# Patient Record
Sex: Female | Born: 2000 | Race: White | Hispanic: No | Smoking: Former smoker
Health system: Southern US, Community
[De-identification: ages and names within clinical notes are randomized; demographics above are authoritative.]

## PROBLEM LIST (undated history)

## (undated) DIAGNOSIS — F329 Major depressive disorder, single episode, unspecified: Secondary | ICD-10-CM

## (undated) DIAGNOSIS — F32A Depression, unspecified: Secondary | ICD-10-CM

## (undated) DIAGNOSIS — R51 Headache: Secondary | ICD-10-CM

## (undated) DIAGNOSIS — Z151 Genetic susceptibility to epilepsy and neurodevelopmental disorders: Secondary | ICD-10-CM

## (undated) DIAGNOSIS — F840 Autistic disorder: Secondary | ICD-10-CM

## (undated) DIAGNOSIS — F419 Anxiety disorder, unspecified: Secondary | ICD-10-CM

## (undated) DIAGNOSIS — R519 Headache, unspecified: Secondary | ICD-10-CM

## (undated) DIAGNOSIS — Q8789 Other specified congenital malformation syndromes, not elsewhere classified: Secondary | ICD-10-CM

## (undated) DIAGNOSIS — Q825 Congenital non-neoplastic nevus: Secondary | ICD-10-CM

## (undated) DIAGNOSIS — H539 Unspecified visual disturbance: Secondary | ICD-10-CM

## (undated) DIAGNOSIS — J45909 Unspecified asthma, uncomplicated: Secondary | ICD-10-CM

## (undated) DIAGNOSIS — M1990 Unspecified osteoarthritis, unspecified site: Secondary | ICD-10-CM

## (undated) DIAGNOSIS — F509 Eating disorder, unspecified: Secondary | ICD-10-CM

## (undated) DIAGNOSIS — F319 Bipolar disorder, unspecified: Secondary | ICD-10-CM

## (undated) DIAGNOSIS — T7840XA Allergy, unspecified, initial encounter: Secondary | ICD-10-CM

## (undated) HISTORY — DX: Congenital non-neoplastic nevus: Q82.5

## (undated) HISTORY — DX: Genetic susceptibility to epilepsy and neurodevelopmental disorders: Z15.1

## (undated) HISTORY — DX: Autistic disorder: F84.0

## (undated) HISTORY — DX: Autistic disorder: Q87.89

---
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
## 2018-01-31 ENCOUNTER — Encounter: Payer: Self-pay | Admitting: Pediatrics

## 2018-04-18 ENCOUNTER — Encounter (HOSPITAL_COMMUNITY): Payer: Self-pay | Admitting: Emergency Medicine

## 2018-04-18 ENCOUNTER — Other Ambulatory Visit: Payer: Self-pay

## 2018-04-18 ENCOUNTER — Emergency Department (HOSPITAL_COMMUNITY)
Admission: EM | Admit: 2018-04-18 | Discharge: 2018-04-19 | Disposition: A | Discharge status: Home / Self Care | Payer: Medicaid Other | Attending: Emergency Medicine | Admitting: Emergency Medicine

## 2018-04-18 DIAGNOSIS — F17200 Nicotine dependence, unspecified, uncomplicated: Secondary | ICD-10-CM | POA: Insufficient documentation

## 2018-04-18 DIAGNOSIS — J45909 Unspecified asthma, uncomplicated: Secondary | ICD-10-CM | POA: Diagnosis not present

## 2018-04-18 DIAGNOSIS — R1030 Lower abdominal pain, unspecified: Secondary | ICD-10-CM | POA: Diagnosis present

## 2018-04-18 DIAGNOSIS — N946 Dysmenorrhea, unspecified: Secondary | ICD-10-CM | POA: Insufficient documentation

## 2018-04-18 LAB — CBC
HCT: 38.4 % (ref 36.0–46.0)
Hemoglobin: 12.9 g/dL (ref 12.0–15.0)
MCH: 27.9 pg (ref 26.0–34.0)
MCHC: 33.6 g/dL (ref 30.0–36.0)
MCV: 82.9 fL (ref 80.0–100.0)
Platelets: 279 10*3/uL (ref 150–400)
RBC: 4.63 MIL/uL (ref 3.87–5.11)
RDW: 12 % (ref 11.5–15.5)
WBC: 8.7 10*3/uL (ref 4.0–10.5)
nRBC: 0 % (ref 0.0–0.2)

## 2018-04-18 LAB — URINALYSIS, ROUTINE W REFLEX MICROSCOPIC
Bacteria, UA: NONE SEEN
Bilirubin Urine: NEGATIVE
Glucose, UA: NEGATIVE mg/dL
Ketones, ur: NEGATIVE mg/dL
Leukocytes, UA: NEGATIVE
Nitrite: NEGATIVE
Protein, ur: NEGATIVE mg/dL
Specific Gravity, Urine: 1.012 (ref 1.005–1.030)
pH: 7 (ref 5.0–8.0)

## 2018-04-18 LAB — COMPREHENSIVE METABOLIC PANEL
ALT: 12 U/L (ref 0–44)
AST: 18 U/L (ref 15–41)
Albumin: 4.7 g/dL (ref 3.5–5.0)
Alkaline Phosphatase: 39 U/L (ref 38–126)
Anion gap: 9 (ref 5–15)
BUN: 10 mg/dL (ref 6–20)
CO2: 24 mmol/L (ref 22–32)
Calcium: 9.2 mg/dL (ref 8.9–10.3)
Chloride: 104 mmol/L (ref 98–111)
Creatinine, Ser: 0.52 mg/dL (ref 0.44–1.00)
GFR calc Af Amer: >60 mL/min (ref >60)
GFR calc non Af Amer: >60 mL/min (ref >60)
Glucose, Bld: 90 mg/dL (ref 70–99)
Potassium: 3.9 mmol/L (ref 3.5–5.1)
Sodium: 137 mmol/L (ref 135–145)
Total Bilirubin: 0.5 mg/dL (ref 0.3–1.2)
Total Protein: 7.8 g/dL (ref 6.5–8.1)

## 2018-04-18 LAB — I-STAT BETA HCG BLOOD, ED (MC, WL, AP ONLY): I-stat hCG, quantitative: <5 m[IU]/mL (ref <5)

## 2018-04-18 LAB — LIPASE, BLOOD: Lipase: 48 U/L (ref 11–51)

## 2018-04-18 MED ORDER — KETOROLAC TROMETHAMINE 30 MG/ML IJ SOLN
15.0000 mg | Freq: Once | INTRAMUSCULAR | Status: AC
  Administered 2018-04-18: 15 mg via INTRAVENOUS
  Filled 2018-04-18: qty 1

## 2018-04-18 MED ORDER — KETOROLAC TROMETHAMINE 60 MG/2ML IM SOLN: 30.0000 mg | Freq: Once | INTRAMUSCULAR | Status: DC

## 2018-04-18 MED ORDER — SODIUM CHLORIDE 0.9% FLUSH
3.0000 mL | Freq: Once | INTRAVENOUS | Status: AC
  Administered 2018-04-18: 3 mL via INTRAVENOUS
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
## 2018-04-18 NOTE — ED Triage Notes (Signed)
Patient reports R lower abdominal pain that started this am. No hematuria but patient does state the pain radiates into her lower back. Nausea but no vomiting.

## 2018-04-19 LAB — WET PREP, GENITAL
Sperm: NONE SEEN
Trich, Wet Prep: NONE SEEN
YEAST WET PREP: NONE SEEN

## 2018-04-19 MED ORDER — NAPROXEN SODIUM 275 MG PO TABS: 275.0000 mg | ORAL_TABLET | Freq: Two times a day (BID) | ORAL | 0 refills | Status: DC

## 2018-04-19 NOTE — Discharge Instructions (Addendum)
All your blood work tonight is normal. Call Dr. Rayna Sexton office to schedule follow up for your pelvic pain and cramping. Take the medication for pain as directed.

## 2018-04-20 LAB — GC/CHLAMYDIA PROBE AMP (~~LOC~~) NOT AT ARMC
Chlamydia: NEGATIVE
NEISSERIA GONORRHEA: NEGATIVE

## 2018-07-24 ENCOUNTER — Telehealth: Payer: Self-pay | Admitting: *Deleted

## 2018-07-24 NOTE — Telephone Encounter (Signed)
Patient informed that we are not allowing visitors or children to come to appointments at this time. Patient denies any contact with anyone suspected or confirmed of having COVID-19. Pt denies fever, cough, sob, muscle pain, diarrhea, rash, vomiting, abdominal pain, red eye, weakness, bruising or bleeding, joint pain or severe headache.  

## 2018-07-25 ENCOUNTER — Ambulatory Visit (INDEPENDENT_AMBULATORY_CARE_PROVIDER_SITE_OTHER): Payer: Medicaid Other | Admitting: Adult Health

## 2018-07-25 ENCOUNTER — Other Ambulatory Visit: Payer: Self-pay

## 2018-07-25 ENCOUNTER — Encounter: Payer: Self-pay | Admitting: Adult Health

## 2018-07-25 DIAGNOSIS — Z3201 Encounter for pregnancy test, result positive: Secondary | ICD-10-CM

## 2018-07-25 DIAGNOSIS — O3680X0 Pregnancy with inconclusive fetal viability, not applicable or unspecified: Secondary | ICD-10-CM | POA: Diagnosis not present

## 2018-07-25 DIAGNOSIS — Z3A14 14 weeks gestation of pregnancy: Secondary | ICD-10-CM

## 2018-07-25 NOTE — Progress Notes (Signed)
Patient ID: Betty Harrison, female   DOB: 2000/08/28, 18 y.o.   MRN: [id number]   TELEHEALTH VIRTUAL GYNECOLOGY VISIT ENCOUNTER NOTE  I connected with Betty Harrison on 07/25/18 at  8:45 AM EDT by telephone at home and verified that I am speaking with the correct person using two identifiers.   I discussed the limitations, risks, security and privacy concerns of performing an evaluation and management service by telephone and the availability of in person appointments. I also discussed with the patient that there may be a patient responsible charge related to this service. The patient expressed understanding and agreed to proceed.   History:  Betty Harrison is a 18 y.o. G1P0000,white  Female,engaged, being evaluated today for having missed several periods and had 2+HPTs.LMP 04/17/2018, so about 14+1 week by LMP with EDD 01/22/2019. She denies any abnormal vaginal discharge, bleeding, pelvic pain or other concerns.       Past Medical History:  Diagnosis Date  . Allergy   . Anxiety   . Asthma   . 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 Bipolar 1 disorder (HCC)   . Depression   . Eating disorder    Pt reported she used to binge eat  . Headache   . Vision abnormalities    Pt wears glasses   History reviewed. No pertinent surgical history. The following portions of the patient's history were reviewed and updated as appropriate: allergies, current medications, past family history, past medical history, past social history, past surgical history and problem list.   Health Maintenance:  Pap at 19  Review of Systems:  Pertinent items noted in HPI and remainder of comprehensive ROS otherwise negative.  Physical Exam:   General:  Alert, oriented and cooperative.   Mental Status: Normal mood and affect perceived. Normal judgment and thought content.  Physical exam deferred due to nature of the encounter LMP 04/17/2018 (Exact Date)   Fall risk is low. PHQ 2  score 0.  She is aware we are part of [facility] and babies [facility]  Labs and Imaging No results found for this or any previous visit (from the past 336 hour(s)). No results found.    Assessment and Plan:     1. Positive pregnancy test -2+HPTs  2. [redacted] weeks gestation of pregnancy -continue OTC PNV  3. Encounter to determine fetal viability of pregnancy, single or unspecified fetus -dating [place] in 1 week       I discussed the assessment and treatment plan with the patient. The patient was provided an opportunity to ask questions and all were answered. The patient agreed with the plan and demonstrated an understanding of the instructions.   The patient was advised to call back or seek an in-person evaluation/go to the ED if the symptoms worsen or if the condition fails to improve as anticipated.  I provided 8 minutes of non-face-to-face time during this encounter.   Cyril Mourning, NP [facility] [employer], [facility]

## 2018-07-31 ENCOUNTER — Other Ambulatory Visit: Payer: Self-pay | Admitting: Adult Health

## 2018-07-31 ENCOUNTER — Ambulatory Visit (INDEPENDENT_AMBULATORY_CARE_PROVIDER_SITE_OTHER): Payer: Medicaid Other

## 2018-07-31 ENCOUNTER — Other Ambulatory Visit: Payer: Self-pay

## 2018-07-31 DIAGNOSIS — O3680X0 Pregnancy with inconclusive fetal viability, not applicable or unspecified: Secondary | ICD-10-CM

## 2018-07-31 DIAGNOSIS — Z362 Encounter for other antenatal screening follow-up: Secondary | ICD-10-CM

## 2018-07-31 NOTE — Progress Notes (Signed)
[place] 15 wks,single IUP,anterior placenta gr 0,normal ovaries bilat,svp of fluid  3.3 cm,fhr 157 bpm,cx 3.1 cm,efw 120 g,limited US,please have pt come back for anatomy scan,EDD 01/22/2019

## 2018-08-15 ENCOUNTER — Telehealth: Payer: Self-pay | Admitting: *Deleted

## 2018-08-15 NOTE — Telephone Encounter (Signed)
Patient informed that we are not allowing visitors or children to come to appointments at this time. Patient advised to wear a mask to her appointment if she has one. Patient denies any contact with anyone suspected or confirmed of having COVID-19. Pt denies fever, cough, sob, muscle pain, diarrhea, Betty Harrison, vomiting, abdominal pain, red eye, weakness, bruising or bleeding, joint pain or severe headache.    

## 2018-08-16 ENCOUNTER — Other Ambulatory Visit: Payer: Self-pay

## 2018-08-16 ENCOUNTER — Telehealth: Payer: Self-pay | Admitting: *Deleted

## 2018-08-16 ENCOUNTER — Ambulatory Visit: Payer: Medicaid Other | Admitting: *Deleted

## 2018-08-16 ENCOUNTER — Ambulatory Visit (INDEPENDENT_AMBULATORY_CARE_PROVIDER_SITE_OTHER): Payer: Medicaid Other | Admitting: Women's Health

## 2018-08-16 ENCOUNTER — Encounter: Payer: Self-pay | Admitting: Women's Health

## 2018-08-16 VITALS — BP 134/80 | HR 82 | Wt 123.0 lb

## 2018-08-16 DIAGNOSIS — Z8659 Personal history of other mental and behavioral disorders: Secondary | ICD-10-CM

## 2018-08-16 DIAGNOSIS — Z3A17 17 weeks gestation of pregnancy: Secondary | ICD-10-CM

## 2018-08-16 DIAGNOSIS — Z1389 Encounter for screening for other disorder: Secondary | ICD-10-CM

## 2018-08-16 DIAGNOSIS — Z3402 Encounter for supervision of normal first pregnancy, second trimester: Secondary | ICD-10-CM | POA: Diagnosis not present

## 2018-08-16 DIAGNOSIS — Z331 Pregnant state, incidental: Secondary | ICD-10-CM

## 2018-08-16 DIAGNOSIS — Z363 Encounter for antenatal screening for malformations: Secondary | ICD-10-CM

## 2018-08-16 DIAGNOSIS — F840 Autistic disorder: Secondary | ICD-10-CM

## 2018-08-16 DIAGNOSIS — O0990 Supervision of high risk pregnancy, unspecified, unspecified trimester: Secondary | ICD-10-CM | POA: Insufficient documentation

## 2018-08-16 DIAGNOSIS — F419 Anxiety disorder, unspecified: Secondary | ICD-10-CM

## 2018-08-16 LAB — POCT URINALYSIS DIPSTICK OB
Blood, UA: NEGATIVE
Glucose, UA: NEGATIVE
Ketones, UA: NEGATIVE
Leukocytes, UA: NEGATIVE
Nitrite, UA: NEGATIVE
POC,PROTEIN,UA: NEGATIVE

## 2018-08-16 MED ORDER — BLOOD PRESSURE MONITOR MISC: 0 refills | Status: AC

## 2018-08-16 NOTE — Addendum Note (Signed)
Addended by: Shawna Clamp R on: 08/16/2018 02:04 PM   Modules accepted: Orders

## 2018-08-16 NOTE — Patient Instructions (Signed)
Betty HarpsAlexis Harrison, I greatly value your feedback.  If you receive a survey following your visit with us today, we appreciate you taking the time to fill it out.  Thanks, Joellyn HaffKim Emmily Pellegrin, CNM, [facility]  [facility] It is now [facility] ([address] [place], [place] [address]) Entrance located off of [address] Free 24/7 valet parking   Home Blood Pressure Monitoring for Patients   Your provider has recommended that you check your blood pressure (BP) at least once a week at home. If you do not have a blood pressure cuff at home, one will be provided for you. Contact your provider if you have not received your monitor within 1 week.   Helpful Tips for Accurate Home Blood Pressure Checks  . Don't smoke, exercise, or drink caffeine 30 minutes before checking your BP . Use the restroom before checking your BP (a full bladder can raise your pressure) . Relax in a comfortable upright chair . Feet on the ground . Left arm resting comfortably on a flat surface at the level of your heart . Legs uncrossed . Back supported . Sit quietly and don't talk . Place the cuff on your bare arm . Adjust snuggly, so that only two fingertips can fit between your skin and the top of the cuff . Check 2 readings separated by at least one minute . Keep a log of your BP readings . For a visual, please reference this diagram: http://ccnc.care/bpdiagram  Provider Name: [facility]     Phone: [phone]  Zone 1: ALL CLEAR  Continue to monitor your symptoms:  . BP reading is less than 140 (top number) or less than 90 (bottom number)  . No right upper stomach pain . No headaches or seeing spots . No feeling nauseated or throwing up . No swelling in face and hands  Zone 2: CAUTION Call your doctor's office for any of the following:  . BP reading is greater than 140 (top number) or greater than 90 (bottom number)  . Stomach pain under your ribs in the middle or  right side . Headaches or seeing spots . Feeling nauseated or throwing up . Swelling in face and hands  Zone 3: EMERGENCY  Seek immediate medical care if you have any of the following:  . BP reading is greater than160 (top number) or greater than 110 (bottom number) . Severe headaches not improving with Tylenol . Serious difficulty catching your breath . Any worsening symptoms from Zone 2     Second Trimester of Pregnancy The second trimester is from week 14 through week 27 (months 4 through 6). The second trimester is often a time when you feel your best. Your body has adjusted to being pregnant, and you begin to feel better physically. Usually, morning sickness has lessened or quit completely, you may have more energy, and you may have an increase in appetite. The second trimester is also a time when the fetus is growing rapidly. At the end of the sixth month, the fetus is about 9 inches long and weighs about 1 pounds. You will likely begin to feel the baby move (quickening) between 16 and 20 weeks of pregnancy. Body changes during your second trimester Your body continues to go through many changes during your second trimester. The changes vary from woman to woman.  Your weight will continue to increase. You will notice your lower abdomen bulging out.  You may begin to get stretch  marks on your hips, abdomen, and breasts.  You may develop headaches that can be relieved by medicines. The medicines should be approved by your health care provider.  You may urinate more often because the fetus is pressing on your bladder.  You may develop or continue to have heartburn as a result of your pregnancy.  You may develop constipation because certain hormones are causing the muscles that push waste through your intestines to slow down.  You may develop hemorrhoids or swollen, bulging veins (varicose veins).  You may have back pain. This is caused by: ? Weight gain. ? Pregnancy hormones  that are relaxing the joints in your pelvis. ? A shift in weight and the muscles that support your balance.  Your breasts will continue to grow and they will continue to become tender.  Your gums may bleed and may be sensitive to brushing and flossing.  Dark spots or blotches (chloasma, mask of pregnancy) may develop on your face. This will likely fade after the baby is born.  A dark line from your belly button to the pubic area (linea nigra) may appear. This will likely fade after the baby is born.  You may have changes in your hair. These can include thickening of your hair, rapid growth, and changes in texture. Some women also have hair loss during or after pregnancy, or hair that feels dry or thin. Your hair will most likely return to normal after your baby is born.  What to expect at prenatal visits During a routine prenatal visit:  You will be weighed to make sure you and the fetus are growing normally.  Your blood pressure will be taken.  Your abdomen will be measured to track your baby's growth.  The fetal heartbeat will be listened to.  Any test results from the previous visit will be discussed.  Your health care provider may ask you:  How you are feeling.  If you are feeling the baby move.  If you have had any abnormal symptoms, such as leaking fluid, bleeding, severe headaches, or abdominal cramping.  If you are using any tobacco products, including cigarettes, chewing tobacco, and electronic cigarettes.  If you have any questions.  Other tests that may be performed during your second trimester include:  Blood tests that check for: ? Low iron levels (anemia). ? High blood sugar that affects pregnant women (gestational diabetes) between 42 and 28 weeks. ? Rh antibodies. This is to check for a protein on red blood cells (Rh factor).  Urine tests to check for infections, diabetes, or protein in the urine.  An ultrasound to confirm the proper growth and  development of the baby.  An amniocentesis to check for possible genetic problems.  Fetal screens for spina bifida and Down syndrome.  HIV (human immunodeficiency virus) testing. Routine prenatal testing includes screening for HIV, unless you choose not to have this test.  Follow these instructions at home: Medicines  Follow your health care provider's instructions regarding medicine use. Specific medicines may be either safe or unsafe to take during pregnancy.  Take a prenatal vitamin that contains at least 600 micrograms (mcg) of folic acid.  If you develop constipation, try taking a stool softener if your health care provider approves. Eating and drinking  Eat a balanced diet that includes fresh fruits and vegetables, whole grains, good sources of protein such as meat, eggs, or tofu, and low-fat dairy. Your health care provider will help you determine the amount of weight gain that is  right for you.  Avoid raw meat and uncooked cheese. These carry germs that can cause birth defects in the baby.  If you have low calcium intake from food, talk to your health care provider about whether you should take a daily calcium supplement.  Limit foods that are high in fat and processed sugars, such as fried and sweet foods.  To prevent constipation: ? Drink enough fluid to keep your urine clear or pale yellow. ? Eat foods that are high in fiber, such as fresh fruits and vegetables, whole grains, and beans. Activity  Exercise only as directed by your health care provider. Most women can continue their usual exercise routine during pregnancy. Try to exercise for 30 minutes at least 5 days a week. Stop exercising if you experience uterine contractions.  Avoid heavy lifting, wear low heel shoes, and practice good posture.  A sexual relationship may be continued unless your health care provider directs you otherwise. Relieving pain and discomfort  Wear a good support bra to prevent discomfort  from breast tenderness.  Take warm sitz baths to soothe any pain or discomfort caused by hemorrhoids. Use hemorrhoid cream if your health care provider approves.  Rest with your legs elevated if you have leg cramps or low back pain.  If you develop varicose veins, wear support hose. Elevate your feet for 15 minutes, 3-4 times a day. Limit salt in your diet. Prenatal Care  Write down your questions. Take them to your prenatal visits.  Keep all your prenatal visits as told by your health care provider. This is important. Safety  Wear your seat belt at all times when driving.  Make a list of emergency phone numbers, including numbers for family, friends, the hospital, and police and fire departments. General instructions  Ask your health care provider for a referral to a local prenatal education class. Begin classes no later than the beginning of month 6 of your pregnancy.  Ask for help if you have counseling or nutritional needs during pregnancy. Your health care provider can offer advice or refer you to specialists for help with various needs.  Do not use hot tubs, steam rooms, or saunas.  Do not douche or use tampons or scented sanitary pads.  Do not cross your legs for long periods of time.  Avoid cat litter boxes and soil used by cats. These carry germs that can cause birth defects in the baby and possibly loss of the fetus by miscarriage or stillbirth.  Avoid all smoking, herbs, alcohol, and unprescribed drugs. Chemicals in these products can affect the formation and growth of the baby.  Do not use any products that contain nicotine or tobacco, such as cigarettes and e-cigarettes. If you need help quitting, ask your health care provider.  Visit your dentist if you have not gone yet during your pregnancy. Use a soft toothbrush to brush your teeth and be gentle when you floss. Contact a health care provider if:  You have dizziness.  You have mild pelvic cramps, pelvic  pressure, or nagging pain in the abdominal area.  You have persistent nausea, vomiting, or diarrhea.  You have a bad smelling vaginal discharge.  You have pain when you urinate. Get help right away if:  You have a fever.  You are leaking fluid from your vagina.  You have spotting or bleeding from your vagina.  You have severe abdominal cramping or pain.  You have rapid weight gain or weight loss.  You have shortness of breath with  chest pain.  You notice sudden or extreme swelling of your face, hands, ankles, feet, or legs.  You have not felt your baby move in over an hour.  You have severe headaches that do not go away when you take medicine.  You have vision changes. Summary  The second trimester is from week 14 through week 27 (months 4 through 6). It is also a time when the fetus is growing rapidly.  Your body goes through many changes during pregnancy. The changes vary from woman to woman.  Avoid all smoking, herbs, alcohol, and unprescribed drugs. These chemicals affect the formation and growth your baby.  Do not use any tobacco products, such as cigarettes, chewing tobacco, and e-cigarettes. If you need help quitting, ask your health care provider.  Contact your health care provider if you have any questions. Keep all prenatal visits as told by your health care provider. This is important. This information is not intended to replace advice given to you by your health care provider. Make sure you discuss any questions you have with your health care provider. Document Released: 03/15/2001 Document Revised: 08/27/2015 Document Reviewed: 05/22/2012 Elsevier Interactive Patient Education  2017 Elsevier Inc.  Coronavirus (COVID-19) Are you at risk?  Are you at risk for the Coronavirus (COVID-19)?  To be considered HIGH RISK for Coronavirus (COVID-19), you have to meet the following criteria:  . Traveled to [place], [place], [place], [place] or [place]; or in the [place] to [place], [place], [place], or [place]; and have fever, cough, and shortness of breath within the last 2 weeks of travel OR . Been in close contact with a person diagnosed with COVID-19 within the last 2 weeks and have fever, cough, and shortness of breath . IF YOU DO NOT MEET THESE CRITERIA, YOU ARE CONSIDERED LOW RISK FOR COVID-19.  What to do if you are HIGH RISK for COVID-19?  Marland Kitchen. If you are having a medical emergency, call 911. . Seek medical care right away. Before you go to a doctor's office, urgent care or emergency department, call ahead and tell them about your recent travel, contact with someone diagnosed with COVID-19, and your symptoms. You should receive instructions from your physician's office regarding next steps of care.  . When you arrive at healthcare provider, tell the healthcare staff immediately you have returned from visiting [place], [place], [place], [place] or [place]; or traveled in the [place] to [place], [place], [place], or [place]; in the last two weeks or you have been in close contact with a person diagnosed with COVID-19 in the last 2 weeks.   . Tell the health care staff about your symptoms: fever, cough and shortness of breath. . After you have been seen by a medical provider, you will be either: o Tested for (COVID-19) and discharged home on quarantine except to seek medical care if symptoms worsen, and asked to  - Stay home and avoid contact with others until you get your results (4-5 days)  - Avoid travel on public transportation if possible (such as bus, train, or airplane) or o Sent to the Emergency Department by EMS for evaluation, COVID-19 testing, and possible admission depending on your condition and test results.  What to do if you are LOW RISK for COVID-19?  Reduce your risk of any infection by using the same precautions used for avoiding the common cold or flu:  Marland Kitchen. Wash your hands often with soap and warm water for at  least 20 seconds.  If soap and water are not readily available, use an alcohol-based hand sanitizer with at least 60% alcohol.  . If coughing or sneezing, cover your mouth and nose by coughing or sneezing into the elbow areas of your shirt or coat, into a tissue or into your sleeve (not your hands). . Avoid shaking hands with others and consider head nods or verbal greetings only. . Avoid touching your eyes, nose, or mouth with unwashed hands.  . Avoid close contact with people who are sick. . Avoid places or events with large numbers of people in one location, like concerts or sporting events. . Carefully consider travel plans you have or are making. . If you are planning any travel outside or inside the [place], visit the CDC's Travelers' Health webpage for the latest health notices. . If you have some symptoms but not all symptoms, continue to monitor at home and seek medical attention if your symptoms worsen. . If you are having a medical emergency, call 911.   ADDITIONAL HEALTHCARE OPTIONS FOR PATIENTS  [place] [facility] / e-Visit: [email or web address]         [facility]: [phone]  [facility]: [phone]                   [facility]: [phone]

## 2018-08-16 NOTE — Telephone Encounter (Signed)
Patient states she does not have Bp cuff.
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
## 2018-08-16 NOTE — Telephone Encounter (Signed)
Called patient and left message to call us back to let us know if she has a bp check.

## 2018-08-17 LAB — URINALYSIS
Bilirubin, UA: NEGATIVE
Glucose, UA: NEGATIVE
Ketones, UA: NEGATIVE
Leukocytes,UA: NEGATIVE
Nitrite, UA: NEGATIVE
Protein,UA: NEGATIVE
RBC, UA: NEGATIVE
Specific Gravity, UA: 1.018 (ref 1.005–1.030)
Urobilinogen, Ur: 0.2 mg/dL (ref 0.2–1.0)
pH, UA: 7 (ref 5.0–7.5)

## 2018-08-17 LAB — OBSTETRIC PANEL, INCLUDING HIV
Antibody Screen: NEGATIVE
Basophils Absolute: 0.1 10*3/uL (ref 0.0–0.2)
Basos: 1 %
EOS (ABSOLUTE): 0 10*3/uL (ref 0.0–0.4)
Eos: 1 %
HIV Screen 4th Generation wRfx: NONREACTIVE
Hematocrit: 35 % (ref 34.0–46.6)
Hemoglobin: 12.6 g/dL (ref 11.1–15.9)
Hepatitis B Surface Ag: NEGATIVE
Immature Grans (Abs): 0 10*3/uL (ref 0.0–0.1)
Immature Granulocytes: 0 %
Lymphocytes Absolute: 1.4 10*3/uL (ref 0.7–3.1)
Lymphs: 20 %
MCH: 29.5 pg (ref 26.6–33.0)
MCHC: 36 g/dL — ABNORMAL HIGH (ref 31.5–35.7)
MCV: 82 fL (ref 79–97)
Monocytes Absolute: 0.4 10*3/uL (ref 0.1–0.9)
Monocytes: 6 %
Neutrophils Absolute: 5.3 10*3/uL (ref 1.4–7.0)
Neutrophils: 72 %
Platelets: 221 10*3/uL (ref 150–450)
RBC: 4.27 x10E6/uL (ref 3.77–5.28)
RDW: 13 % (ref 11.7–15.4)
RPR Ser Ql: NONREACTIVE
Rh Factor: POSITIVE
Rubella Antibodies, IGG: 2.71 index (ref >0.99)
WBC: 7.3 10*3/uL (ref 3.4–10.8)

## 2018-08-17 LAB — PMP SCREEN PROFILE (10S), URINE
Amphetamine Scrn, Ur: NEGATIVE ng/mL
BARBITURATE SCREEN URINE: NEGATIVE ng/mL
BENZODIAZEPINE SCREEN, URINE: NEGATIVE ng/mL
CANNABINOIDS UR QL SCN: NEGATIVE ng/mL
Cocaine (Metab) Scrn, Ur: NEGATIVE ng/mL
Creatinine(Crt), U: 96.7 mg/dL (ref 20.0–300.0)
Methadone Screen, Urine: NEGATIVE ng/mL
OXYCODONE+OXYMORPHONE UR QL SCN: NEGATIVE ng/mL
Opiate Scrn, Ur: NEGATIVE ng/mL
Ph of Urine: 7.3 (ref 4.5–8.9)
Phencyclidine Qn, Ur: NEGATIVE ng/mL
Propoxyphene Scrn, Ur: NEGATIVE ng/mL

## 2018-08-17 LAB — SICKLE CELL SCREEN: Sickle Cell Screen: NEGATIVE

## 2018-08-20 LAB — INHERITEST CORE(CF97,SMA,FRAX)

## 2018-08-20 LAB — URINE CULTURE

## 2018-08-21 ENCOUNTER — Other Ambulatory Visit: Payer: Self-pay | Admitting: Women's Health

## 2018-08-21 DIAGNOSIS — R8271 Bacteriuria: Secondary | ICD-10-CM | POA: Insufficient documentation

## 2018-08-21 LAB — GC/CHLAMYDIA PROBE AMP
Chlamydia trachomatis, NAA: NEGATIVE
Neisseria Gonorrhoeae by PCR: NEGATIVE

## 2018-08-21 MED ORDER — PENICILLIN V POTASSIUM 500 MG PO TABS: 500.0000 mg | ORAL_TABLET | Freq: Four times a day (QID) | ORAL | 0 refills | Status: DC

## 2018-08-24 ENCOUNTER — Other Ambulatory Visit: Payer: Self-pay

## 2018-08-24 ENCOUNTER — Ambulatory Visit (INDEPENDENT_AMBULATORY_CARE_PROVIDER_SITE_OTHER): Payer: Medicaid Other

## 2018-08-24 DIAGNOSIS — Z363 Encounter for antenatal screening for malformations: Secondary | ICD-10-CM

## 2018-08-24 DIAGNOSIS — Z3402 Encounter for supervision of normal first pregnancy, second trimester: Secondary | ICD-10-CM

## 2018-08-24 DIAGNOSIS — Z3A18 18 weeks gestation of pregnancy: Secondary | ICD-10-CM | POA: Diagnosis not present

## 2018-08-24 NOTE — Progress Notes (Signed)
[place] 18+3 wks,cephalic,cx 3.8 cm,anterior placenta gr 0,normal ovaries bilat,svp of fluid 4.4 cm,fhr 154 bpm,efw 243 g 48%,anatomy complete,no obvious abnormalities

## 2018-08-31 LAB — AFP TETRA
DIA Mom Value: 0.38
DIA Value (EIA): 70.62 pg/mL
DSR (By Age)    1 IN: 1161
DSR (Second Trimester) 1 IN: 10000
Gestational Age: 17.2 WEEKS
MSAFP Mom: 1.09
MSAFP: 47.6 ng/mL
MSHCG Mom: 0.32
MSHCG: 12150 m[IU]/mL
Maternal Age At EDD: 20.2 yr
Osb Risk: 9231
T18 (By Age): 1:4522 {titer}
Test Results:: NEGATIVE
Weight: 123 [lb_av]
uE3 Mom: 1.52
uE3 Value: 1.89 ng/mL

## 2018-08-31 LAB — INHERITEST CORE(CF97,SMA,FRAX)

## 2018-09-11 ENCOUNTER — Encounter: Payer: Self-pay | Admitting: *Deleted

## 2018-09-11 ENCOUNTER — Telehealth: Payer: Self-pay | Admitting: Women's Health

## 2018-09-11 NOTE — Telephone Encounter (Signed)
Pt states that she is having pain with intercoarse and is wanting to know if this can be normal with pregnancy.

## 2018-09-17 ENCOUNTER — Encounter: Payer: Self-pay | Admitting: Advanced Practice Midwife

## 2018-09-17 ENCOUNTER — Ambulatory Visit (INDEPENDENT_AMBULATORY_CARE_PROVIDER_SITE_OTHER): Payer: Medicaid Other | Admitting: Advanced Practice Midwife

## 2018-09-17 ENCOUNTER — Other Ambulatory Visit: Payer: Self-pay

## 2018-09-17 VITALS — BP 118/78 | HR 76

## 2018-09-17 DIAGNOSIS — Z3A21 21 weeks gestation of pregnancy: Secondary | ICD-10-CM

## 2018-09-17 DIAGNOSIS — Z3402 Encounter for supervision of normal first pregnancy, second trimester: Secondary | ICD-10-CM
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
## 2018-09-28 ENCOUNTER — Encounter (HOSPITAL_COMMUNITY): Payer: Self-pay | Admitting: *Deleted

## 2018-09-28 ENCOUNTER — Other Ambulatory Visit: Payer: Self-pay

## 2018-09-28 ENCOUNTER — Inpatient Hospital Stay (HOSPITAL_COMMUNITY)
Admission: AD | Admit: 2018-09-28 | Discharge: 2018-09-28 | Disposition: A | Discharge status: Home / Self Care | Payer: Medicaid Other | Attending: Obstetrics and Gynecology | Admitting: Obstetrics and Gynecology

## 2018-09-28 DIAGNOSIS — B9689 Other specified bacterial agents as the cause of diseases classified elsewhere: Secondary | ICD-10-CM | POA: Diagnosis not present

## 2018-09-28 DIAGNOSIS — Z3A23 23 weeks gestation of pregnancy: Secondary | ICD-10-CM | POA: Diagnosis not present

## 2018-09-28 DIAGNOSIS — Z87891 Personal history of nicotine dependence: Secondary | ICD-10-CM | POA: Insufficient documentation

## 2018-09-28 DIAGNOSIS — O23592 Infection of other part of genital tract in pregnancy, second trimester: Secondary | ICD-10-CM | POA: Insufficient documentation

## 2018-09-28 DIAGNOSIS — R109 Unspecified abdominal pain: Secondary | ICD-10-CM | POA: Insufficient documentation

## 2018-09-28 DIAGNOSIS — O479 False labor, unspecified: Secondary | ICD-10-CM

## 2018-09-28 DIAGNOSIS — N760 Acute vaginitis: Secondary | ICD-10-CM | POA: Diagnosis not present

## 2018-09-28 LAB — URINALYSIS, ROUTINE W REFLEX MICROSCOPIC
Bilirubin Urine: NEGATIVE
Glucose, UA: NEGATIVE mg/dL
Hgb urine dipstick: NEGATIVE
Ketones, ur: NEGATIVE mg/dL
Leukocytes,Ua: NEGATIVE
Nitrite: NEGATIVE
Protein, ur: NEGATIVE mg/dL
Specific Gravity, Urine: 1.008 (ref 1.005–1.030)
pH: 9 — ABNORMAL HIGH (ref 5.0–8.0)

## 2018-09-28 LAB — WET PREP, GENITAL
Sperm: NONE SEEN
Trich, Wet Prep: NONE SEEN
Yeast Wet Prep HPF POC: NONE SEEN

## 2018-09-28 LAB — FETAL FIBRONECTIN: Fetal Fibronectin: NEGATIVE

## 2018-09-28 LAB — OB RESULTS CONSOLE GC/CHLAMYDIA: Gonorrhea: NEGATIVE

## 2018-09-28 MED ORDER — OXYCODONE-ACETAMINOPHEN 5-325 MG PO TABS
1.0000 | ORAL_TABLET | Freq: Once | ORAL | Status: AC
  Administered 2018-09-28: 1 via ORAL
  Filled 2018-09-28: qty 1

## 2018-09-28 MED ORDER — METRONIDAZOLE 0.75 % VA GEL: 1.0000 | Freq: Every day | VAGINAL | 0 refills | Status: DC

## 2018-09-28 NOTE — Discharge Instructions (Signed)
Preventing Preterm Birth  Preterm birth is when your baby is delivered between 20 weeks and 37 weeks of pregnancy. A full-term pregnancy lasts for at least 37 weeks. Preterm birth can be dangerous for your baby because the last few weeks of pregnancy are an important time for your baby's brain and lungs to grow. Many things can cause a baby to be born early. Sometimes the cause is not known. There are certain factors that make you more likely to experience preterm birth, such as:  · Having a previous baby born preterm.  · Being pregnant with twins or other multiples.  · Having had fertility treatment.  · Being overweight or underweight at the start of your pregnancy.  · Having any of the following during pregnancy:  ? An infection, including a urinary tract infection (UTI) or an STI (sexually transmitted infection).  ? High blood pressure.  ? Diabetes.  ? Vaginal bleeding.  · Being age 35 or older.  · Being age 18 or younger.  · Getting pregnant within 6 months of a previous pregnancy.  · Suffering extreme stress or physical or emotional abuse during pregnancy.  · Standing for long periods of time during pregnancy, such as working at a job that requires standing.  What are the risks?  The most serious risk of preterm birth is that the baby may not survive. This is more likely to happen if a baby is born before 34 weeks. Other risks and complications of preterm birth may include your baby having:  · Breathing problems.  · Brain damage that affects movement and coordination (cerebral palsy).  · Feeding difficulties.  · Vision or hearing problems.  · Infections or inflammation of the digestive tract (colitis).  · Developmental delays.  · Learning disabilities.  · Higher risk for diabetes, heart disease, and high blood pressure later in life.  What can I do to lower my risk?    Medical care  The most important thing you can do to lower your risk for preterm birth is to get routine medical care during pregnancy (prenatal  care). If you have a high risk of preterm birth, you may be referred to a health care provider who specializes in managing high-risk pregnancies (perinatologist). You may be given medicine to help prevent preterm birth.  Lifestyle changes  Certain lifestyle changes can also lower your risk of preterm birth:  · Wait at least 6 months after a pregnancy to become pregnant again.  · Try to plan pregnancy for when you are between 19 and 35 years old.  · Get to a healthy weight before getting pregnant. If you are overweight, work with your health care provider to safely lose weight.  · Do not use any products that contain nicotine or tobacco, such as cigarettes and e-cigarettes. If you need help quitting, ask your health care provider.  · Do not drink alcohol.  · Do not use drugs.  Where to find support  For more support, consider:  · Talking with your health care provider.  · Talking with a therapist or substance abuse counselor, if you need help quitting.  · Working with a diet and nutrition specialist (dietitian) or a personal trainer to maintain a healthy weight.  · Joining a support group.  Where to find more information  Learn more about preventing preterm birth from:  · Centers for Disease Control and Prevention: cdc.gov/reproductivehealth/maternalinfanthealth/pretermbirth.htm  · March of Dimes: marchofdimes.org/complications/premature-babies.aspx  · American Pregnancy Association: americanpregnancy.org/labor-and-birth/premature-labor  Contact a health care   Regular tightening (contractions) in your lower abdomen. Summary  Preterm birth means having your baby during weeks 20-37 of pregnancy.  Preterm birth may put your baby at risk for physical and  mental problems.  Getting good prenatal care can help prevent preterm birth.  You can lower your risk of preterm birth by making certain lifestyle changes, such as not smoking and not using alcohol. This information is not intended to replace advice given to you by your health care provider. Make sure you discuss any questions you have with your health care provider. Document Released: 05/05/2015 Document Revised: 03/03/2017 Document Reviewed: 11/28/2015 Elsevier Patient Education  2020 [employer]. Bacterial Vaginosis  Bacterial vaginosis is a vaginal infection that occurs when the normal balance of bacteria in the vagina is disrupted. It results from an overgrowth of certain bacteria. This is the most common vaginal infection among women ages 2015-44. Because bacterial vaginosis increases your risk for STIs (sexually transmitted infections), getting treated can help reduce your risk for chlamydia, gonorrhea, herpes, and HIV (human immunodeficiency virus). Treatment is also important for preventing complications in pregnant women, because this condition can cause an early (premature) delivery. What are the causes? This condition is caused by an increase in harmful bacteria that are normally present in small amounts in the vagina. However, the reason that the condition develops is not fully understood. What increases the risk? The following factors may make you more likely to develop this condition:  Having a new sexual partner or multiple sexual partners.  Having unprotected sex.  Douching.  Having an intrauterine device (IUD).  Smoking.  Drug and alcohol abuse.  Taking certain antibiotic medicines.  Being pregnant. You cannot get bacterial vaginosis from toilet seats, bedding, swimming pools, or contact with objects around you. What are the signs or symptoms? Symptoms of this condition include:  Grey or white vaginal discharge. The discharge can also be watery or foamy.  A  fish-like odor with discharge, especially after sexual intercourse or during menstruation.  Itching in and around the vagina.  Burning or pain with urination. Some women with bacterial vaginosis have no signs or symptoms. How is this diagnosed? This condition is diagnosed based on:  Your medical history.  A physical exam of the vagina.  Testing a sample of vaginal fluid under a microscope to look for a large amount of bad bacteria or abnormal cells. Your health care provider may use a cotton swab or a small wooden spatula to collect the sample. How is this treated? This condition is treated with antibiotics. These may be given as a pill, a vaginal cream, or a medicine that is put into the vagina (suppository). If the condition comes back after treatment, a second round of antibiotics may be needed. Follow these instructions at home: Medicines  Take over-the-counter and prescription medicines only as told by your health care provider.  Take or use your antibiotic as told by your health care provider. Do not stop taking or using the antibiotic even if you start to feel better. General instructions  If you have a female sexual partner, tell her that you have a vaginal infection. She should see her health care provider and be treated if she has symptoms. If you have a female sexual partner, he does not need treatment.  During treatment: ? Avoid sexual activity until you finish treatment. ? Do not douche. ? Avoid alcohol as directed by your health care provider. ? Avoid breastfeeding as directed by your health care provider.  Drink enough water and fluids to keep your urine clear or pale yellow.  Keep the area around your vagina and rectum clean. ? Wash the area daily with warm water. ? Wipe yourself from front to back after using the toilet.  Keep all follow-up visits as told by your health care provider. This is important. How is this prevented?  Do not douche.  Wash the outside  of your vagina with warm water only.  Use protection when having sex. This includes latex condoms and dental dams.  Limit how many sexual partners you have. To help prevent bacterial vaginosis, it is best to have sex with just one partner (monogamous).  Make sure you and your sexual partner are tested for STIs.  Wear cotton or cotton-lined underwear.  Avoid wearing tight pants and pantyhose, especially during summer.  Limit the amount of alcohol that you drink.  Do not use any products that contain nicotine or tobacco, such as cigarettes and e-cigarettes. If you need help quitting, ask your health care provider.  Do not use illegal drugs. Where to find more information  Centers for Disease Control and Prevention: AppraiserFraud.fi  American Sexual Health Association (ASHA): www.ashastd.org  U.S. Department of Health and Financial controller, Office on Women's Health: DustingSprays.pl or SecuritiesCard.it Contact a health care provider if:  Your symptoms do not improve, even after treatment.  You have more discharge or pain when urinating.  You have a fever.  You have pain in your abdomen.  You have pain during sex.  You have vaginal bleeding between periods. Summary  Bacterial vaginosis is a vaginal infection that occurs when the normal balance of bacteria in the vagina is disrupted.  Because bacterial vaginosis increases your risk for STIs (sexually transmitted infections), getting treated can help reduce your risk for chlamydia, gonorrhea, herpes, and HIV (human immunodeficiency virus). Treatment is also important for preventing complications in pregnant women, because the condition can cause an early (premature) delivery.  This condition is treated with antibiotic medicines. These may be given as a pill, a vaginal cream, or a medicine that is put into the vagina (suppository). This information is not intended to replace advice  given to you by your health care provider. Make sure you discuss any questions you have with your health care provider. Document Released: 03/21/2005 Document Revised: 03/03/2017 Document Reviewed: 12/05/2015 Elsevier Patient Education  2020 [employer].

## 2018-09-28 NOTE — MAU Note (Signed)
Pt had bad contractions last night. Was admitted to [facility] overnight givne some pain  Medication and ctx slowed down and she felt better. Released around 12pm today. Went home and took a nap. Woke up about an hour ago in severe pain again with contractions. Denies any vag bleeding or leaking. Told she was not dilated last night.

## 2018-09-28 NOTE — MAU Provider Note (Signed)
History     CSN: [id number]  Arrival date and time: 09/28/18 2033   First Provider Initiated Contact with Patient 09/28/18 2108      Chief Complaint  Patient presents with  . Contractions   Betty Harrison is a 18 y.o. G1P0 at [redacted]w[redacted]d who receives care at [facility].  She presents today for Contractions.  She states the contractions started Wednesday and she went to [place] around 10pm on Thursday.  She states she was given pain medication, IV fluid, and an ultrasound.  She states initially they suspected a UTI or kidney stones, but was discharged without a definite diagnosis and a closed cervix.  She states the contractions are intermittent and start in her abdomen and radiate to her back.  She states she has not taken any medication since discharge and has been drinking lots of water and gatorade.  Patient endorses fetal movement and denies vaginal bleeding and leaking, but endorses some discharge.       OB History    Gravida  1   Para  0   Term  0   Preterm  0   AB  0   Living  0     SAB  0   TAB  0   Ectopic  0   Multiple  0   Live Births  0           Past Medical History:  Diagnosis Date  . Allergy   . Anxiety   . Asthma   . 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 Bipolar 1 disorder ([place])   . Depression   . Eating disorder    Pt reported she used to binge eat  . Headache   . Vision abnormalities    Pt wears glasses    History reviewed. No pertinent surgical history.  Family History  Problem Relation Age of Onset  . Asthma Mother   . Osteoarthritis Mother   . Diabetes Father   . Hypertension Father   . Crohn's disease Father   . Cancer Maternal Grandmother   . Blindness Paternal Uncle   . Deafness Paternal Uncle     Social History   Tobacco Use  . Smoking status: Former Research scientist (life sciences)  . Smokeless tobacco: Never Used  . Tobacco comment: stopped when she had upt+  Substance Use Topics  . Alcohol use: No     Alcohol/week: 0.0 standard drinks  . Drug use: No    Allergies:  Allergies  Allergen Reactions  . Ivp Dye [Iodinated Diagnostic Agents] Anaphylaxis  . Eggs Or Egg-Derived Products Nausea Only  . Pollen Extract Other (See Comments)    unknown    Medications Prior to Admission  Medication Sig Dispense Refill Last Dose  . Blood Pressure Monitor MISC For regular home bp monitoring during pregnancy 1 each 0   . penicillin v potassium (VEETID) 500 MG tablet Take 1 tablet (500 mg total) by mouth 4 (four) times daily. X 7 days (Patient not taking: Reported on 09/17/2018) 28 tablet 0   . Prenatal Vit-Fe Fumarate-FA (MULTIVITAMIN-PRENATAL) 27-0.8 MG TABS tablet Take 1 tablet by mouth daily at 12 noon.       Review of Systems  Constitutional: Negative for chills and fever.  Respiratory: Negative for cough and shortness of breath.   Gastrointestinal: Positive for abdominal pain. Negative for constipation, diarrhea, nausea and vomiting.  Genitourinary: Positive for vaginal discharge. Negative for difficulty urinating, dysuria and vaginal  bleeding.  Musculoskeletal: Positive for back pain.  Neurological: Negative for dizziness, light-headedness and headaches.   Physical Exam   Blood pressure 123/71, pulse 80, temperature 98.3 F (36.8 C), resp. rate 18, last menstrual period 04/17/2018.  Physical Exam  Constitutional: She is oriented to person, place, and time. She appears well-developed and well-nourished. She appears distressed.  HENT:  Head: Normocephalic and atraumatic.  Eyes: Conjunctivae are normal.  Neck: Normal range of motion.  Cardiovascular: Normal rate, regular rhythm and normal heart sounds.  Respiratory: Effort normal and breath sounds normal.  GI: Soft. There is no abdominal tenderness.  Genitourinary: Cervix exhibits friability. Cervix exhibits no motion tenderness and no discharge.    Vaginal discharge present.     No vaginal bleeding.  No bleeding in the vagina.     Genitourinary Comments: Sterile Speculum Exam: -Vaginal Vault: Pink mucosa.  Moderate amt thin white discharge in vault -wet prep collected -Cervix:Pink, no lesions, cysts, or polyps.  Appears closed. No active bleeding from os-GC/CT collected-Friable  -Bimanual Exam: Closed   Musculoskeletal: Normal range of motion.        General: No edema.  Neurological: She is alert and oriented to person, place, and time.  Skin: Skin is warm and dry.  Psychiatric: She has a normal mood and affect. Her behavior is normal.    Fetal Assessment 155 bpm, Mod Var, -Decels, -Accels Toco: Irritability Noted  MAU Course   Results for orders placed or performed during the hospital encounter of 09/28/18 (from the past 24 hour(s))  Fetal fibronectin     Status: None   Collection Time: 09/28/18  9:32 PM  Result Value Ref Range   Fetal Fibronectin NEGATIVE NEGATIVE  Wet prep, genital     Status: Abnormal   Collection Time: 09/28/18  9:32 PM  Result Value Ref Range   Yeast Wet Prep HPF POC NONE SEEN NONE SEEN   Trich, Wet Prep NONE SEEN NONE SEEN   Clue Cells Wet Prep HPF POC PRESENT (A) NONE SEEN   WBC, Wet Prep HPF POC MANY (A) NONE SEEN   Sperm NONE SEEN   Urinalysis, Routine w reflex microscopic     Status: Abnormal   Collection Time: 09/28/18  9:39 PM  Result Value Ref Range   Color, Urine STRAW (A) YELLOW   APPearance CLEAR CLEAR   Specific Gravity, Urine 1.008 1.005 - 1.030   pH 9.0 (H) 5.0 - 8.0   Glucose, UA NEGATIVE NEGATIVE mg/dL   Hgb urine dipstick NEGATIVE NEGATIVE   Bilirubin Urine NEGATIVE NEGATIVE   Ketones, ur NEGATIVE NEGATIVE mg/dL   Protein, ur NEGATIVE NEGATIVE mg/dL   Nitrite NEGATIVE NEGATIVE   Leukocytes,Ua NEGATIVE NEGATIVE   No results found.  MDM PE Labs: Wet prep, GC/CT, fFN EFM Pain Medication  Assessment and Plan  18 year old G1P0  SIUP at 23.3weeks Cat I FT Abdominal Pain  -Exam findings discussed. -Informed that cervix is closed, but will send fFN to  rule out PTL. -Wet prep collected and sent. -Offered and accepts pain medication. Will give one tablet of percocet. -Will await results.   Follow Up (11:29 PM) Bacterial Vaginosis  -Wet prep returns significant for clue cells -Results discussed with patient and educated on how this contributes to contractions and PTL -Rx for Metrogel 0.75% PV QHS x 5days sent to pharmacy on file.  -Patient reports improvement with percocet dosing. -No questions or concerns. -Keep appt as scheduled: July 20th -PTL Precautions -Encouraged to call or return to  MAU if symptoms worsen or with the onset of new symptoms. -Discharged to home in improved condition.  Cherre RobinsJessica L Kourtni Stineman MSN, CNM 09/28/2018, 9:08 PM

## 2018-10-02 LAB — GC/CHLAMYDIA PROBE AMP (~~LOC~~) NOT AT ARMC
Chlamydia: NEGATIVE
Neisseria Gonorrhea: NEGATIVE

## 2018-10-22 ENCOUNTER — Ambulatory Visit (INDEPENDENT_AMBULATORY_CARE_PROVIDER_SITE_OTHER): Payer: Medicaid Other | Admitting: Women's Health

## 2018-10-22 ENCOUNTER — Encounter: Payer: Self-pay | Admitting: Women's Health

## 2018-10-22 ENCOUNTER — Other Ambulatory Visit: Payer: Medicaid Other

## 2018-10-22 ENCOUNTER — Other Ambulatory Visit: Payer: Self-pay

## 2018-10-22 VITALS — BP 121/77 | HR 106 | Wt 138.5 lb

## 2018-10-22 DIAGNOSIS — O0990 Supervision of high risk pregnancy, unspecified, unspecified trimester: Secondary | ICD-10-CM

## 2018-10-22 DIAGNOSIS — Z331 Pregnant state, incidental: Secondary | ICD-10-CM

## 2018-10-22 DIAGNOSIS — Z3A26 26 weeks gestation of pregnancy: Secondary | ICD-10-CM

## 2018-10-22 DIAGNOSIS — N883 Incompetence of cervix uteri: Secondary | ICD-10-CM | POA: Insufficient documentation

## 2018-10-22 DIAGNOSIS — Z3402 Encounter for supervision of normal first pregnancy, second trimester: Secondary | ICD-10-CM

## 2018-10-22 DIAGNOSIS — Z1389 Encounter for screening for other disorder: Secondary | ICD-10-CM

## 2018-10-22 DIAGNOSIS — Z87440 Personal history of urinary (tract) infections: Secondary | ICD-10-CM

## 2018-10-22 DIAGNOSIS — Z23 Encounter for immunization: Secondary | ICD-10-CM | POA: Diagnosis not present

## 2018-10-22 DIAGNOSIS — O0992 Supervision of high risk pregnancy, unspecified, second trimester: Secondary | ICD-10-CM

## 2018-10-22 LAB — POCT URINALYSIS DIPSTICK OB
Blood, UA: NEGATIVE
Glucose, UA: NEGATIVE
Ketones, UA: NEGATIVE
Leukocytes, UA: NEGATIVE
Nitrite, UA: NEGATIVE
POC,PROTEIN,UA: NEGATIVE

## 2018-10-22 MED ORDER — PROGESTERONE MICRONIZED 200 MG PO CAPS: 200.0000 mg | ORAL_CAPSULE | Freq: Every day | ORAL | 5 refills | Status: DC

## 2018-10-22 NOTE — Patient Instructions (Signed)
Betty Harrison, I greatly value your feedback.  If you receive a survey following your visit with [place] today, we appreciate you taking the time to fill it out.  Thanks, Betty Harrison, CNM, [facility]  [place]!!! It is now Betty Harrison at [facility] ([place], [place] [address]) Entrance located off of [place] parking    Go to [email or web address] to register for FREE online childbirth classes   Call the office [phone]) or go to [facility] if:  You begin to have strong, frequent contractions  Your water breaks.  Sometimes it is a big gush of fluid, sometimes it is just a trickle that keeps getting your panties wet or running down your legs  You have vaginal bleeding.  It is normal to have a small amount of spotting if your cervix was checked.   You don't feel your baby moving like normal.  If you don't, get you something to eat and drink and lay down and focus on feeling your baby move.  You should feel at least 10 movements in 2 hours.  If you don't, you should call the office or go to [facility].    Tdap Vaccine  It is recommended that you get the Tdap vaccine during the third trimester of EACH pregnancy to help protect your baby from getting pertussis (whooping cough)  27-36 weeks is the BEST time to do this so that you can pass the protection on to your baby. During pregnancy is better than after pregnancy, but if you are unable to get it during pregnancy it will be offered at the hospital.   You can get this vaccine with [place], at the health department, your family doctor, or some local pharmacies  Everyone who will be around your baby should also be up-to-date on their vaccines before the baby comes. Adults (who are not pregnant) only need 1 dose of Tdap during adulthood.   [place] Pediatricians/Family Doctors:  [facility] [phone]                  [place] [phone] (usually not accepting new patients unless you have family there already, you are always welcome to call and ask)       [facility] [phone]       [facility] Pediatricians/Family Doctors:   [facility]: [phone]  [facility]: [phone]  [facility]: [place] Doctors:   [facility]: [facility]: [place]:  [place]: [phone]   Home Blood Pressure Monitoring for Patients   Your provider has recommended that you check your blood pressure (BP) at least once a week at home. If you do not have a blood pressure cuff at home, one will be provided for you. Contact your provider if you have not received your monitor within 1 week.   Helpful Tips for Accurate Home Blood Pressure Checks  . Don't smoke, exercise, or drink caffeine 30 minutes before checking your BP . Use the restroom before checking your BP (a full bladder can raise your pressure) . Relax in a comfortable upright chair . Feet on the ground . Left arm resting comfortably on a flat surface at the level of your heart . Legs uncrossed . Back supported . Sit quietly and don't talk .  Place the cuff on your bare arm . Adjust snuggly, so that only two fingertips can fit between your skin and the top of the cuff . Check 2 readings separated by at least one minute . Keep a log of your BP readings . For a visual, please reference this diagram: http://ccnc.care/bpdiagram  Provider Name: [facility]     Phone: [phone]  Zone 1: ALL CLEAR  Continue to monitor your symptoms:  . BP reading is less than 140 (top number) or less than 90 (bottom number)  . No right upper stomach pain . No headaches or seeing spots . No feeling nauseated or throwing up . No swelling in face and  hands  Zone 2: CAUTION Call your doctor's office for any of the following:  . BP reading is greater than 140 (top number) or greater than 90 (bottom number)  . Stomach pain under your ribs in the middle or right side . Headaches or seeing spots . Feeling nauseated or throwing up . Swelling in face and hands  Zone 3: EMERGENCY  Seek immediate medical care if you have any of the following:  . BP reading is greater than160 (top number) or greater than 110 (bottom number) . Severe headaches not improving with Tylenol . Serious difficulty catching your breath . Any worsening symptoms from Zone 2   Third Trimester of Pregnancy The third trimester is from week 29 through week 42, months 7 through 9. The third trimester is a time when the fetus is growing rapidly. At the end of the ninth month, the fetus is about 20 inches in length and weighs 6-10 pounds.  BODY CHANGES Your body goes through many changes during pregnancy. The changes vary from woman to woman.   Your weight will continue to increase. You can expect to gain 25-35 pounds (11-16 kg) by the end of the pregnancy.  You may begin to get stretch marks on your hips, abdomen, and breasts.  You may urinate more often because the fetus is moving lower into your pelvis and pressing on your bladder.  You may develop or continue to have heartburn as a result of your pregnancy.  You may develop constipation because certain hormones are causing the muscles that push waste through your intestines to slow down.  You may develop hemorrhoids or swollen, bulging veins (varicose veins).  You may have pelvic pain because of the weight gain and pregnancy hormones relaxing your joints between the bones in your pelvis. Backaches may result from overexertion of the muscles supporting your posture.  You may have changes in your hair. These can include thickening of your hair, rapid growth, and changes in texture. Some women also have hair loss during  or after pregnancy, or hair that feels dry or thin. Your hair will most likely return to normal after your baby is born.  Your breasts will continue to grow and be tender. A yellow discharge may leak from your breasts called colostrum.  Your belly button may stick out.  You may feel short of breath because of your expanding uterus.  You may notice the fetus "dropping," or moving lower in your abdomen.  You may have a bloody mucus discharge. This usually occurs a few days to a week before labor begins.  Your cervix becomes thin and soft (effaced) near your due date. WHAT TO EXPECT AT YOUR PRENATAL EXAMS  You will have prenatal exams every 2 weeks until week 36. Then, you will have weekly prenatal exams. During  a routine prenatal visit:  You will be weighed to make sure you and the fetus are growing normally.  Your blood pressure is taken.  Your abdomen will be measured to track your baby's growth.  The fetal heartbeat will be listened to.  Any test results from the previous visit will be discussed.  You may have a cervical check near your due date to see if you have effaced. At around 36 weeks, your caregiver will check your cervix. At the same time, your caregiver will also perform a test on the secretions of the vaginal tissue. This test is to determine if a type of bacteria, Group B streptococcus, is present. Your caregiver will explain this further. Your caregiver may ask you:  What your birth plan is.  How you are feeling.  If you are feeling the baby move.  If you have had any abnormal symptoms, such as leaking fluid, bleeding, severe headaches, or abdominal cramping.  If you have any questions. Other tests or screenings that may be performed during your third trimester include:  Blood tests that check for low iron levels (anemia).  Fetal testing to check the health, activity level, and growth of the fetus. Testing is done if you have certain medical conditions or if  there are problems during the pregnancy. FALSE LABOR You may feel small, irregular contractions that eventually go away. These are called Braxton Hicks contractions, or false labor. Contractions may last for hours, days, or even weeks before true labor sets in. If contractions come at regular intervals, intensify, or become painful, it is best to be seen by your caregiver.  SIGNS OF LABOR   Menstrual-like cramps.  Contractions that are 5 minutes apart or less.  Contractions that start on the top of the uterus and spread down to the lower abdomen and back.  A sense of increased pelvic pressure or back pain.  A watery or bloody mucus discharge that comes from the vagina. If you have any of these signs before the 37th week of pregnancy, call your caregiver right away. You need to go to the hospital to get checked immediately. HOME CARE INSTRUCTIONS   Avoid all smoking, herbs, alcohol, and unprescribed drugs. These chemicals affect the formation and growth of the baby.  Follow your caregiver's instructions regarding medicine use. There are medicines that are either safe or unsafe to take during pregnancy.  Exercise only as directed by your caregiver. Experiencing uterine cramps is a good sign to stop exercising.  Continue to eat regular, healthy meals.  Wear a good support bra for breast tenderness.  Do not use hot tubs, steam rooms, or saunas.  Wear your seat belt at all times when driving.  Avoid raw meat, uncooked cheese, cat litter boxes, and soil used by cats. These carry germs that can cause birth defects in the baby.  Take your prenatal vitamins.  Try taking a stool softener (if your caregiver approves) if you develop constipation. Eat more high-fiber foods, such as fresh vegetables or fruit and whole grains. Drink plenty of fluids to keep your urine clear or pale yellow.  Take warm sitz baths to soothe any pain or discomfort caused by hemorrhoids. Use hemorrhoid cream if your  caregiver approves.  If you develop varicose veins, wear support hose. Elevate your feet for 15 minutes, 3-4 times a day. Limit salt in your diet.  Avoid heavy lifting, wear low heal shoes, and practice good posture.  Rest a lot with your legs elevated if you  have leg cramps or low back pain.  Visit your dentist if you have not gone during your pregnancy. Use a soft toothbrush to brush your teeth and be gentle when you floss.  A sexual relationship may be continued unless your caregiver directs you otherwise.  Do not travel far distances unless it is absolutely necessary and only with the approval of your caregiver.  Take prenatal classes to understand, practice, and ask questions about the labor and delivery.  Make a trial run to the hospital.  Pack your hospital bag.  Prepare the baby's nursery.  Continue to go to all your prenatal visits as directed by your caregiver. SEEK MEDICAL CARE IF:  You are unsure if you are in labor or if your water has broken.  You have dizziness.  You have mild pelvic cramps, pelvic pressure, or nagging pain in your abdominal area.  You have persistent nausea, vomiting, or diarrhea.  You have a bad smelling vaginal discharge.  You have pain with urination. SEEK IMMEDIATE MEDICAL CARE IF:   You have a fever.  You are leaking fluid from your vagina.  You have spotting or bleeding from your vagina.  You have severe abdominal cramping or pain.  You have rapid weight loss or gain.  You have shortness of breath with chest pain.  You notice sudden or extreme swelling of your face, hands, ankles, feet, or legs.  You have not felt your baby move in over an hour.  You have severe headaches that do not go away with medicine.  You have vision changes. Document Released: 03/15/2001 Document Revised: 03/26/2013 Document Reviewed: 05/22/2012 [facility] Patient Information 2015 [place], [place]. This information is not intended to replace advice  given to you by your health care provider. Make sure you discuss any questions you have with your health care provider.

## 2018-10-22 NOTE — Progress Notes (Signed)
HIGH-RISK PREGNANCY VISIT Patient name: Betty Harrison MRN [id number]  Date of birth: [birth year] Chief Complaint:   Routine Prenatal Visit (PN2 today; has been to ER 3 times with contractions)  History of Present Illness:   Betty Harrison is a 18 y.o. G1P0000 female at [redacted]w[redacted]d with an Estimated Date of Delivery: 01/22/19 being seen today for ongoing management of a high-risk pregnancy complicated by short cx 8.3MO @ 23wks.  Today she reports went to [facility] and [facility] w/ uc's, rx'd flagyl for BV at [facility]- cx was closed, fFN was neg. At [facility] had renal u/s for flank pain which was neg, short cx 1.5cm, was not rx'd prometrium or notified about short cx. Brought u/s reports w/ her today. Contractions much better now that she is staying hydrated. Wants to deliver at [facility]- much closer for her. Contractions: Not present. Vag. Bleeding: None.  Movement: Present. denies leaking of fluid.  Review of Systems:   Pertinent items are noted in HPI Denies abnormal vaginal discharge w/ itching/odor/irritation, headaches, visual changes, shortness of breath, chest pain, abdominal pain, severe nausea/vomiting, or problems with urination or bowel movements unless otherwise stated above. Pertinent History Reviewed:  Reviewed past medical,surgical, social, obstetrical and family history.  Reviewed problem list, medications and allergies. Physical Assessment:   Vitals:   10/22/18 0926  BP: 121/77  Pulse: (!) 106  Weight: 138 lb 8 oz (62.8 kg)  Body mass index is 23.77 kg/m.           Physical Examination:   General appearance: alert, well appearing, and in no distress  Mental status: alert, oriented to person, place, and time  Skin: warm & dry   Extremities: Edema: Trace    Cardiovascular: normal heart rate noted  Respiratory: normal respiratory effort, no distress  Abdomen: gravid, soft, non-tender  Pelvic: Cervical exam deferred         Fetal Status: Fetal Heart Rate (bpm): 163 Fundal Height: 27 cm  Movement: Present    Fetal Surveillance Testing today: doppler   Results for orders placed or performed in visit on 10/22/18 (from the past 24 hour(s))  POC Urinalysis Dipstick OB   Collection Time: 10/22/18  9:27 AM  Result Value Ref Range   Color, UA     Clarity, UA     Glucose, UA Negative Negative   Bilirubin, UA     Ketones, UA neg    Spec Grav, UA     Blood, UA neg    pH, UA     POC,PROTEIN,UA Negative Negative, Trace, Small (1+), Moderate (2+), Large (3+), 4+   Urobilinogen, UA     Nitrite, UA neg    Leukocytes, UA Negative Negative   Appearance     Odor      Assessment & Plan:  1) High-risk pregnancy G1P0000 at [redacted]w[redacted]d with an Estimated Date of Delivery: 01/22/19   2) Short cx, dx @ 23wks @ [facility], notified us today, rx prometrium, pelvic rest. Reviewed ptl s/s, reasons to seek care  3) UTI earlier pregnancy, urine cx poc today  Meds:  Meds ordered this encounter  Medications  . progesterone (PROMETRIUM) 200 MG capsule    Sig: Place 1 capsule (200 mg total) vaginally at bedtime.    Dispense:  30 capsule    Refill:  5    Order Specific Question:   Supervising Provider    Answer:   Tania Ade H [[id number]]    Labs/procedures today: pn2, tdap  Treatment Plan:  prometrium nightly  until 37wks  Reviewed: Preterm labor symptoms and general obstetric precautions including but not limited to vaginal bleeding, contractions, leaking of fluid and fetal movement were reviewed in detail with the patient.  All questions were answered. Has home bp cuff. Check bp weekly, let us know if >140/90.   Follow-up: Return in about 2 weeks (around 11/05/2018) for LROB, Webex.  Orders Placed This Encounter  Procedures  . Urine Culture  . POC Urinalysis Dipstick OB   Cheral MarkerKimberly R Eleena Grater CNM, [facility] 10/22/2018 10:16 AM

## 2018-10-22 NOTE — Addendum Note (Signed)
Addended by: Linton Rump on: 10/22/2018 10:23 AM   Modules accepted: Orders

## 2018-10-24 LAB — RPR: RPR Ser Ql: NONREACTIVE

## 2018-10-24 LAB — URINE CULTURE

## 2018-10-24 LAB — GLUCOSE TOLERANCE, 2 HOURS W/ 1HR
Glucose, 1 hour: 62 mg/dL — ABNORMAL LOW (ref 65–179)
Glucose, 2 hour: 73 mg/dL (ref 65–152)
Glucose, Fasting: 77 mg/dL (ref 65–91)

## 2018-10-24 LAB — CBC
Hematocrit: 33.7 % — ABNORMAL LOW (ref 34.0–46.6)
Hemoglobin: 11.5 g/dL (ref 11.1–15.9)
MCH: 29.9 pg (ref 26.6–33.0)
MCHC: 34.1 g/dL (ref 31.5–35.7)
MCV: 88 fL (ref 79–97)
Platelets: 206 10*3/uL (ref 150–450)
RBC: 3.85 x10E6/uL (ref 3.77–5.28)
RDW: 11.9 % (ref 11.7–15.4)
WBC: 8.3 10*3/uL (ref 3.4–10.8)

## 2018-10-24 LAB — ANTIBODY SCREEN: Antibody Screen: NEGATIVE

## 2018-10-24 LAB — HIV ANTIBODY (ROUTINE TESTING W REFLEX): HIV Screen 4th Generation wRfx: NONREACTIVE

## 2018-10-25 ENCOUNTER — Telehealth: Payer: Self-pay | Admitting: Women's Health

## 2018-10-25 ENCOUNTER — Telehealth: Payer: Self-pay | Admitting: *Deleted

## 2018-10-25 NOTE — Telephone Encounter (Signed)
Patient states her phone has died and can no longer get into her babyscripts as her phone is a flip phone.  Concerned about putting BP readings in Babyscripts. Advised to just write her BP down and can bring to appointments.

## 2018-10-25 NOTE — Telephone Encounter (Signed)
patient called stating that her Phone has died on her and she had to get a new phone. Pt states that she knows Baby script is important to place her BP number in there. Pt states that her Phone is a flip phone and can not add apps what else can she do to log in her BP numbers. Please contact pt

## 2018-11-01 ENCOUNTER — Inpatient Hospital Stay (HOSPITAL_COMMUNITY)
Admission: AD | Admit: 2018-11-01 | Discharge: 2018-11-01 | Disposition: A | Discharge status: Home / Self Care | Payer: Medicaid Other | Source: Ambulatory Visit | Attending: Obstetrics & Gynecology | Admitting: Obstetrics & Gynecology

## 2018-11-01 ENCOUNTER — Encounter (HOSPITAL_COMMUNITY): Payer: Self-pay | Admitting: *Deleted

## 2018-11-01 DIAGNOSIS — O23593 Infection of other part of genital tract in pregnancy, third trimester: Secondary | ICD-10-CM | POA: Diagnosis not present

## 2018-11-01 DIAGNOSIS — Z87891 Personal history of nicotine dependence: Secondary | ICD-10-CM | POA: Diagnosis not present

## 2018-11-01 DIAGNOSIS — R109 Unspecified abdominal pain: Secondary | ICD-10-CM | POA: Diagnosis present

## 2018-11-01 DIAGNOSIS — B9689 Other specified bacterial agents as the cause of diseases classified elsewhere: Secondary | ICD-10-CM | POA: Diagnosis not present

## 2018-11-01 DIAGNOSIS — Z3689 Encounter for other specified antenatal screening: Secondary | ICD-10-CM

## 2018-11-01 DIAGNOSIS — O479 False labor, unspecified: Secondary | ICD-10-CM

## 2018-11-01 DIAGNOSIS — Z3A28 28 weeks gestation of pregnancy: Secondary | ICD-10-CM | POA: Diagnosis not present

## 2018-11-01 DIAGNOSIS — Z0371 Encounter for suspected problem with amniotic cavity and membrane ruled out: Secondary | ICD-10-CM

## 2018-11-01 LAB — URINALYSIS, ROUTINE W REFLEX MICROSCOPIC
Bilirubin Urine: NEGATIVE
Glucose, UA: NEGATIVE mg/dL
Hgb urine dipstick: NEGATIVE
Ketones, ur: NEGATIVE mg/dL
Nitrite: NEGATIVE
Protein, ur: NEGATIVE mg/dL
Specific Gravity, Urine: 1.01 (ref 1.005–1.030)
pH: 7.5 (ref 5.0–8.0)

## 2018-11-01 LAB — URINALYSIS, MICROSCOPIC (REFLEX)

## 2018-11-01 LAB — WET PREP, GENITAL
Sperm: NONE SEEN
Trich, Wet Prep: NONE SEEN
Yeast Wet Prep HPF POC: NONE SEEN

## 2018-11-01 MED ORDER — LACTATED RINGERS IV BOLUS
1000.0000 mL | Freq: Once | INTRAVENOUS | Status: AC
  Administered 2018-11-01: 02:00:00 1000 mL via INTRAVENOUS

## 2018-11-01 MED ORDER — NIFEDIPINE 10 MG PO CAPS
10.0000 mg | ORAL_CAPSULE | ORAL | Status: DC | PRN
  Administered 2018-11-01: 10 mg via ORAL
  Filled 2018-11-01: qty 1

## 2018-11-01 MED ORDER — ACETAMINOPHEN 500 MG PO TABS
1000.0000 mg | ORAL_TABLET | Freq: Once | ORAL | Status: AC
  Administered 2018-11-01: 1000 mg via ORAL
  Filled 2018-11-01: qty 2

## 2018-11-01 MED ORDER — METRONIDAZOLE 500 MG PO TABS: 500.0000 mg | ORAL_TABLET | Freq: Two times a day (BID) | ORAL | 0 refills | Status: DC

## 2018-11-01 NOTE — MAU Provider Note (Signed)
History     CSN: [id number]  Arrival date and time: 11/01/18 0116   First Provider Initiated Contact with Patient 11/01/18 0157      Chief Complaint  Patient presents with  . Abdominal Pain  . Rupture of Membranes   Cheyne Bungert is a 18 y.o. G1P0 at [redacted]w[redacted]d who receives care at [facility].  She presents today for Abdominal Pain and Rupture of Membranes. Patient states she started haivng lower abdominal pain around 1030pm. She states that she started feeling nauseous  around 1115pm and went to the bathroom.  She states she had two gushes of fluid while sitting on the toilet, but does not know if it was clear or if it had an odor.  She states she started having contractions around midnight and states she continues to leak fluid.  Patient endorses fetal movement and denies other vaginal concerns including discharge or bleeding.  She denies sexual activity since progesterone initiation last week.        OB History    Gravida  1   Para  0   Term  0   Preterm  0   AB  0   Living  0     SAB  0   TAB  0   Ectopic  0   Multiple  0   Live Births  0           Past Medical History:  Diagnosis Date  . Allergy   . Anxiety   . Asthma   . 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 Bipolar 1 disorder ([place])   . Depression   . Eating disorder    Pt reported she used to binge eat  . Headache   . Vision abnormalities    Pt wears glasses    History reviewed. No pertinent surgical history.  Family History  Problem Relation Age of Onset  . Asthma Mother   . Osteoarthritis Mother   . Diabetes Father   . Hypertension Father   . Crohn's disease Father   . Cancer Maternal Grandmother   . Blindness Paternal Uncle   . Deafness Paternal Uncle     Social History   Tobacco Use  . Smoking status: Former Research scientist (life sciences)  . Smokeless tobacco: Never Used  . Tobacco comment: stopped when she had upt+  Substance Use Topics  . Alcohol use: No     Alcohol/week: 0.0 standard drinks  . Drug use: No    Allergies:  Allergies  Allergen Reactions  . Ivp Dye [Iodinated Diagnostic Agents] Anaphylaxis  . Bee Venom     Skin reaction   . Eggs Or Egg-Derived Products Nausea Only  . Pollen Extract Other (See Comments)    unknown    Medications Prior to Admission  Medication Sig Dispense Refill Last Dose  . acetaminophen (TYLENOL) 500 MG tablet Take 500 mg by mouth as needed.   Past Month at Unknown time  . Blood Pressure Monitor MISC For regular home bp monitoring during pregnancy 1 each 0 Past Week at Unknown time  . Prenatal Vit-Fe Fumarate-FA (MULTIVITAMIN-PRENATAL) 27-0.8 MG TABS tablet Take 1 tablet by mouth daily at 12 noon.   10/31/2018 at Unknown time  . progesterone (PROMETRIUM) 200 MG capsule Place 1 capsule (200 mg total) vaginally at bedtime. 30 capsule 5 10/31/2018 at Unknown time    Review of Systems  Constitutional: Negative for chills and fever.  Respiratory: Negative for cough and  shortness of breath.   Gastrointestinal: Positive for abdominal pain and nausea. Negative for constipation, diarrhea and vomiting.  Genitourinary: Positive for pelvic pain. Negative for difficulty urinating, dysuria, vaginal bleeding and vaginal discharge.  Musculoskeletal: Positive for back pain.  Neurological: Positive for light-headedness. Negative for dizziness and headaches.   Physical Exam   Blood pressure 131/75, pulse 92, temperature 98.5 F (36.9 C), temperature source Oral, resp. rate 19, last menstrual period 04/17/2018, SpO2 98 %.  Physical Exam  Constitutional: She is oriented to person, place, and time. She appears well-developed and well-nourished.  HENT:  Head: Normocephalic and atraumatic.  Eyes: Conjunctivae are normal.  Neck: Normal range of motion.  Cardiovascular: Normal rate.  Respiratory: Effort normal.  GI: Soft.  Genitourinary: Cervix exhibits no discharge.    Vaginal discharge present.     No vaginal  bleeding.  No bleeding in the vagina.    Genitourinary Comments: Sterile Speculum Exam: NEFG -Vaginal Vault: Pink Mucosa.  Small amt thin white discharge, apparent odor -wet prep collected -Cervix:Pink, no lesions, cysts, or polyps.  Appears closed. No active bleeding from os-GC/CT collected -Bimanual Exam: Closed    Musculoskeletal: Normal range of motion.  Neurological: She is alert and oriented to person, place, and time.  Skin: Skin is warm and dry.  Psychiatric: She has a normal mood and affect. Her behavior is normal.   Fetal Monitoring: 150 bpm, Mod Var, -Decels, +Accels Toco: Q1-4 minutes  MAU Course  Procedures Results for orders placed or performed during the hospital encounter of 11/01/18 (from the past 24 hour(s))  Urinalysis, Routine w reflex microscopic     Status: Abnormal   Collection Time: 11/01/18  1:30 AM  Result Value Ref Range   Color, Urine YELLOW YELLOW   APPearance HAZY (A) CLEAR   Specific Gravity, Urine 1.010 1.005 - 1.030   pH 7.5 5.0 - 8.0   Glucose, UA NEGATIVE NEGATIVE mg/dL   Hgb urine dipstick NEGATIVE NEGATIVE   Bilirubin Urine NEGATIVE NEGATIVE   Ketones, ur NEGATIVE NEGATIVE mg/dL   Protein, ur NEGATIVE NEGATIVE mg/dL   Nitrite NEGATIVE NEGATIVE   Leukocytes,Ua MODERATE (A) NEGATIVE  Urinalysis, Microscopic (reflex)     Status: Abnormal   Collection Time: 11/01/18  1:30 AM  Result Value Ref Range   RBC / HPF 0-5 0 - 5 RBC/hpf   WBC, UA 6-10 0 - 5 WBC/hpf   Bacteria, UA MANY (A) NONE SEEN   Squamous Epithelial / LPF 6-10 0 - 5  Wet prep, genital     Status: Abnormal   Collection Time: 11/01/18  2:13 AM  Result Value Ref Range   Yeast Wet Prep HPF POC NONE SEEN NONE SEEN   Trich, Wet Prep NONE SEEN NONE SEEN   Clue Cells Wet Prep HPF POC PRESENT (A) NONE SEEN   WBC, Wet Prep HPF POC MANY (A) NONE SEEN   Sperm NONE SEEN     MDM Exam Labs: UA, Wet Prep, GC/CT, Fern, UC IV with LR Bolus Tocolytic-Oral EFM Pain  Medication Assessment and Plan  18 year old G1P0 SIUP at 28.2 weeks Cat I FT Contractions  -Exam findings discussed. -Informed exam not indicative of ROM, but fern collected and negative -Will start IV with fluid bolus.  -Give tylenol for pain. -Start procardia per protocol for contractions.  -Will continue to monitor and reassess.  Reassessment (3:25 AM) Bacterial Vaginosis  -Labs reviewed.  -Wet prep returns significant for clue cells. -Rx for flagyl sent to pharmacy on file.  -  Discussed importance of taking medication as prescribed and in its entirety.  -UA with many bacteria; will send for culture.  -Results discussed with patient. -Patient reports no change in contractions, but goes on to state she no longer has pain with contractions and they are not as frequent. -Patient without questions or concerns.  -NST remains reactive.  -Will discharge after completion of fluids. -Encouraged to call or return to MAU if symptoms worsen or with the onset of new symptoms. -PTL Precautions Given -Instructed to keep scheduled PNV. -Orders placed and nurse instructed to discharge, after bolus complete, if patient remains stable.   Cherre RobinsJessica L Merrisa Skorupski MSN, CNM 11/01/2018, 1:57 AM

## 2018-11-01 NOTE — Discharge Instructions (Signed)

## 2018-11-01 NOTE — MAU Note (Signed)
Pt reports to MAU via EMS c/o possible SROM around 2315 pt reports she had peed and had a BM before that. Pt reports the pain stared around 2230 and it is a sharp pain that comes and goes on her abdomen back to her back. Pt reports a decrease in FM. Pt reports feeling drained after her BM tonight.

## 2018-11-02 LAB — CULTURE, OB URINE: Culture: <10000 — AB

## 2018-11-07 ENCOUNTER — Ambulatory Visit (INDEPENDENT_AMBULATORY_CARE_PROVIDER_SITE_OTHER): Payer: Medicaid Other | Admitting: Obstetrics and Gynecology

## 2018-11-07 ENCOUNTER — Other Ambulatory Visit: Payer: Self-pay

## 2018-11-07 ENCOUNTER — Encounter: Payer: Self-pay | Admitting: Obstetrics and Gynecology

## 2018-11-07 VITALS — BP 125/64 | HR 82 | Wt 134.0 lb

## 2018-11-07 DIAGNOSIS — Z3A29 29 weeks gestation of pregnancy: Secondary | ICD-10-CM

## 2018-11-07 DIAGNOSIS — Z331 Pregnant state, incidental: Secondary | ICD-10-CM

## 2018-11-07 DIAGNOSIS — Z1389 Encounter for screening for other disorder: Secondary | ICD-10-CM

## 2018-11-07 DIAGNOSIS — O0990 Supervision of high risk pregnancy, unspecified, unspecified trimester: Secondary | ICD-10-CM

## 2018-11-07 DIAGNOSIS — N883 Incompetence of cervix uteri: Secondary | ICD-10-CM

## 2018-11-07 DIAGNOSIS — O0993 Supervision of high risk pregnancy, unspecified, third trimester: Secondary | ICD-10-CM

## 2018-11-07 LAB — POCT URINALYSIS DIPSTICK OB
Blood, UA: NEGATIVE
Glucose, UA: NEGATIVE
Ketones, UA: NEGATIVE
Nitrite, UA: NEGATIVE
POC,PROTEIN,UA: NEGATIVE

## 2018-11-07 NOTE — Patient Instructions (Signed)
Third Trimester of Pregnancy The third trimester is from week 28 through week 40 (months 7 through 9). The third trimester is a time when the unborn baby (fetus) is growing rapidly. At the end of the ninth month, the fetus is about 20 inches in length and weighs 6-10 pounds. Body changes during your third trimester Your body will continue to go through many changes during pregnancy. The changes vary from woman to woman. During the third trimester:  Your weight will continue to increase. You can expect to gain 25-35 pounds (11-16 kg) by the end of the pregnancy.  You may begin to get stretch marks on your hips, abdomen, and breasts.  You may urinate more often because the fetus is moving lower into your pelvis and pressing on your bladder.  You may develop or continue to have heartburn. This is caused by increased hormones that slow down muscles in the digestive tract.  You may develop or continue to have constipation because increased hormones slow digestion and cause the muscles that push waste through your intestines to relax.  You may develop hemorrhoids. These are swollen veins (varicose veins) in the rectum that can itch or be painful.  You may develop swollen, bulging veins (varicose veins) in your legs.  You may have increased body aches in the pelvis, back, or thighs. This is due to weight gain and increased hormones that are relaxing your joints.  You may have changes in your hair. These can include thickening of your hair, rapid growth, and changes in texture. Some women also have hair loss during or after pregnancy, or hair that feels dry or thin. Your hair will most likely return to normal after your baby is born.  Your breasts will continue to grow and they will continue to become tender. A yellow fluid (colostrum) may leak from your breasts. This is the first milk you are producing for your baby.  Your belly button may stick out.  You may notice more swelling in your hands,  face, or ankles.  You may have increased tingling or numbness in your hands, arms, and legs. The skin on your belly may also feel numb.  You may feel short of breath because of your expanding uterus.  You may have more problems sleeping. This can be caused by the size of your belly, increased need to urinate, and an increase in your body's metabolism.  You may notice the fetus "dropping," or moving lower in your abdomen (lightening).  You may have increased vaginal discharge.  You may notice your joints feel loose and you may have pain around your pelvic bone. What to expect at prenatal visits You will have prenatal exams every 2 weeks until week 36. Then you will have weekly prenatal exams. During a routine prenatal visit:  You will be weighed to make sure you and the baby are growing normally.  Your blood pressure will be taken.  Your abdomen will be measured to track your baby's growth.  The fetal heartbeat will be listened to.  Any test results from the previous visit will be discussed.  You may have a cervical check near your due date to see if your cervix has softened or thinned (effaced).  You will be tested for Group B streptococcus. This happens between 35 and 37 weeks. Your health care provider may ask you:  What your birth plan is.  How you are feeling.  If you are feeling the baby move.  If you have had any abnormal   symptoms, such as leaking fluid, bleeding, severe headaches, or abdominal cramping.  If you are using any tobacco products, including cigarettes, chewing tobacco, and electronic cigarettes.  If you have any questions. Other tests or screenings that may be performed during your third trimester include:  Blood tests that check for low iron levels (anemia).  Fetal testing to check the health, activity level, and growth of the fetus. Testing is done if you have certain medical conditions or if there are problems during the pregnancy.  Nonstress test  (NST). This test checks the health of your baby to make sure there are no signs of problems, such as the baby not getting enough oxygen. During this test, a belt is placed around your belly. The baby is made to move, and its heart rate is monitored during movement. What is false labor? False labor is a condition in which you feel small, irregular tightenings of the muscles in the womb (contractions) that usually go away with rest, changing position, or drinking water. These are called Braxton Hicks contractions. Contractions may last for hours, days, or even weeks before true labor sets in. If contractions come at regular intervals, become more frequent, increase in intensity, or become painful, you should see your health care provider. What are the signs of labor?  Abdominal cramps.  Regular contractions that start at 10 minutes apart and become stronger and more frequent with time.  Contractions that start on the top of the uterus and spread down to the lower abdomen and back.  Increased pelvic pressure and dull back pain.  A watery or bloody mucus discharge that comes from the vagina.  Leaking of amniotic fluid. This is also known as your "water breaking." It could be a slow trickle or a gush. Let your health care provider know if it has a color or strange odor. If you have any of these signs, call your health care provider right away, even if it is before your due date. Follow these instructions at home: Medicines  Follow your health care provider's instructions regarding medicine use. Specific medicines may be either safe or unsafe to take during pregnancy.  Take a prenatal vitamin that contains at least 600 micrograms (mcg) of folic acid.  If you develop constipation, try taking a stool softener if your health care provider approves. Eating and drinking   Eat a balanced diet that includes fresh fruits and vegetables, whole grains, good sources of protein such as meat, eggs, or tofu,  and low-fat dairy. Your health care provider will help you determine the amount of weight gain that is right for you.  Avoid raw meat and uncooked cheese. These carry germs that can cause birth defects in the baby.  If you have low calcium intake from food, talk to your health care provider about whether you should take a daily calcium supplement.  Eat four or five small meals rather than three large meals a day.  Limit foods that are high in fat and processed sugars, such as fried and sweet foods.  To prevent constipation: ? Drink enough fluid to keep your urine clear or pale yellow. ? Eat foods that are high in fiber, such as fresh fruits and vegetables, whole grains, and beans. Activity  Exercise only as directed by your health care provider. Most women can continue their usual exercise routine during pregnancy. Try to exercise for 30 minutes at least 5 days a week. Stop exercising if you experience uterine contractions.  Avoid heavy lifting.  Do   not exercise in extreme heat or humidity, or at high altitudes.  Wear low-heel, comfortable shoes.  Practice good posture.  You may continue to have sex unless your health care provider tells you otherwise. Relieving pain and discomfort  Take frequent breaks and rest with your legs elevated if you have leg cramps or low back pain.  Take warm sitz baths to soothe any pain or discomfort caused by hemorrhoids. Use hemorrhoid cream if your health care provider approves.  Wear a good support bra to prevent discomfort from breast tenderness.  If you develop varicose veins: ? Wear support pantyhose or compression stockings as told by your healthcare provider. ? Elevate your feet for 15 minutes, 3-4 times a day. Prenatal care  Write down your questions. Take them to your prenatal visits.  Keep all your prenatal visits as told by your health care provider. This is important. Safety  Wear your seat belt at all times when driving.  Make  a list of emergency phone numbers, including numbers for family, friends, the hospital, and police and fire departments. General instructions  Avoid cat litter boxes and soil used by cats. These carry germs that can cause birth defects in the baby. If you have a cat, ask someone to clean the litter box for you.  Do not travel far distances unless it is absolutely necessary and only with the approval of your health care provider.  Do not use hot tubs, steam rooms, or saunas.  Do not drink alcohol.  Do not use any products that contain nicotine or tobacco, such as cigarettes and e-cigarettes. If you need help quitting, ask your health care provider.  Do not use any medicinal herbs or unprescribed drugs. These chemicals affect the formation and growth of the baby.  Do not douche or use tampons or scented sanitary pads.  Do not cross your legs for long periods of time.  To prepare for the arrival of your baby: ? Take prenatal classes to understand, practice, and ask questions about labor and delivery. ? Make a trial run to the hospital. ? Visit the hospital and tour the maternity area. ? Arrange for maternity or paternity leave through employers. ? Arrange for family and friends to take care of pets while you are in the hospital. ? Purchase a rear-facing car seat and make sure you know how to install it in your car. ? Pack your hospital bag. ? Prepare the baby's nursery. Make sure to remove all pillows and stuffed animals from the baby's crib to prevent suffocation.  Visit your dentist if you have not gone during your pregnancy. Use a soft toothbrush to brush your teeth and be gentle when you floss. Contact a health care provider if:  You are unsure if you are in labor or if your water has broken.  You become dizzy.  You have mild pelvic cramps, pelvic pressure, or nagging pain in your abdominal area.  You have lower back pain.  You have persistent nausea, vomiting, or diarrhea.   You have an unusual or bad smelling vaginal discharge.  You have pain when you urinate. Get help right away if:  Your water breaks before 37 weeks.  You have regular contractions less than 5 minutes apart before 37 weeks.  You have a fever.  You are leaking fluid from your vagina.  You have spotting or bleeding from your vagina.  You have severe abdominal pain or cramping.  You have rapid weight loss or weight gain.  You have   shortness of breath with chest pain.  You notice sudden or extreme swelling of your face, hands, ankles, feet, or legs.  Your baby makes fewer than 10 movements in 2 hours.  You have severe headaches that do not go away when you take medicine.  You have vision changes. Summary  The third trimester is from week 28 through week 40, months 7 through 9. The third trimester is a time when the unborn baby (fetus) is growing rapidly.  During the third trimester, your discomfort may increase as you and your baby continue to gain weight. You may have abdominal, leg, and back pain, sleeping problems, and an increased need to urinate.  During the third trimester your breasts will keep growing and they will continue to become tender. A yellow fluid (colostrum) may leak from your breasts. This is the first milk you are producing for your baby.  False labor is a condition in which you feel small, irregular tightenings of the muscles in the womb (contractions) that eventually go away. These are called Braxton Hicks contractions. Contractions may last for hours, days, or even weeks before true labor sets in.  Signs of labor can include: abdominal cramps; regular contractions that start at 10 minutes apart and become stronger and more frequent with time; watery or bloody mucus discharge that comes from the vagina; increased pelvic pressure and dull back pain; and leaking of amniotic fluid. This information is not intended to replace advice given to you by your health  care provider. Make sure you discuss any questions you have with your health care provider. Document Released: 03/15/2001 Document Revised: 07/12/2018 Document Reviewed: 04/26/2016 Elsevier Patient Education  2020 [employer]  
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
## 2018-11-22 ENCOUNTER — Encounter: Payer: Self-pay | Admitting: Obstetrics & Gynecology

## 2018-11-22 ENCOUNTER — Ambulatory Visit (INDEPENDENT_AMBULATORY_CARE_PROVIDER_SITE_OTHER): Payer: Medicaid Other | Admitting: Obstetrics & Gynecology

## 2018-11-22 ENCOUNTER — Other Ambulatory Visit: Payer: Self-pay

## 2018-11-22 VITALS — BP 128/76 | HR 107 | Wt 137.0 lb

## 2018-11-22 DIAGNOSIS — Z3A31 31 weeks gestation of pregnancy: Secondary | ICD-10-CM

## 2018-11-22 DIAGNOSIS — Z1389 Encounter for screening for other disorder: Secondary | ICD-10-CM

## 2018-11-22 DIAGNOSIS — Z331 Pregnant state, incidental: Secondary | ICD-10-CM

## 2018-11-22 DIAGNOSIS — Z3403 Encounter for supervision of normal first pregnancy, third trimester: Secondary | ICD-10-CM

## 2018-11-22 NOTE — Progress Notes (Signed)
   LOW-RISK PREGNANCY VISIT Patient name: Betty Harrison MRN [id number]  Date of birth: 06/17/2000 Chief Complaint:   Routine Prenatal Visit  History of Present Illness:   Betty Harrison is a 18 y.o. G57P0000 female at [redacted]w[redacted]d with an Estimated Date of Delivery: 01/22/19 being seen today for ongoing management of a low-risk pregnancy.  Today she reports no complaints. Contractions: Not present. Vag. Bleeding: None.  Movement: Present. denies leaking of fluid. Review of Systems:   Pertinent items are noted in HPI Denies abnormal vaginal discharge w/ itching/odor/irritation, headaches, visual changes, shortness of breath, chest pain, abdominal pain, severe nausea/vomiting, or problems with urination or bowel movements unless otherwise stated above. Pertinent History Reviewed:  Reviewed past medical,surgical, social, obstetrical and family history.  Reviewed problem list, medications and allergies. Physical Assessment:   Vitals:   11/22/18 1113  BP: 128/76  Pulse: (!) 107  Weight: 137 lb (62.1 kg)  Body mass index is 23.52 kg/m.        Physical Examination:   General appearance: Well appearing, and in no distress  Mental status: Alert, oriented to person, place, and time  Skin: Warm & dry  Cardiovascular: Normal heart rate noted  Respiratory: Normal respiratory effort, no distress  Abdomen: Soft, gravid, nontender  Pelvic: Cervical exam deferred         Extremities: Edema: Trace  Fetal Status:     Movement: Present    No results found for this or any previous visit (from the past 24 hour(s)).  Assessment & Plan:  1) Low-risk pregnancy G1P0000 at [redacted]w[redacted]d with an Estimated Date of Delivery: 01/22/19   2) Assymptomatic short cervix on prometrium 200 mg qhs,    Meds: No orders of the defined types were placed in this encounter.  Labs/procedures today:   Plan:  Continue routine obstetrical care   Reviewed: Preterm labor symptoms and general obstetric precautions including but not  limited to vaginal bleeding, contractions, leaking of fluid and fetal movement were reviewed in detail with the patient.  All questions were answered. Has home bp cuff. Rx faxed to . Check bp weekly, let us know if >140/90.   Follow-up: Return in about 2 weeks (around 12/06/2018) for [place].  Orders Placed This Encounter  Procedures  . POC Urinalysis Dipstick OB   Mertie Clause Eure  11/22/2018 11:27 AM

## 2018-11-26 ENCOUNTER — Encounter (HOSPITAL_COMMUNITY): Payer: Self-pay | Admitting: *Deleted

## 2018-11-26 ENCOUNTER — Inpatient Hospital Stay (HOSPITAL_COMMUNITY)
Admission: AD | Admit: 2018-11-26 | Discharge: 2018-11-27 | Disposition: A | Discharge status: Home / Self Care | Payer: Medicaid Other | Source: Ambulatory Visit | Attending: Obstetrics & Gynecology | Admitting: Obstetrics & Gynecology

## 2018-11-26 ENCOUNTER — Other Ambulatory Visit: Payer: Self-pay

## 2018-11-26 DIAGNOSIS — Z3689 Encounter for other specified antenatal screening: Secondary | ICD-10-CM

## 2018-11-26 DIAGNOSIS — Z3A31 31 weeks gestation of pregnancy: Secondary | ICD-10-CM

## 2018-11-26 DIAGNOSIS — Z833 Family history of diabetes mellitus: Secondary | ICD-10-CM | POA: Insufficient documentation

## 2018-11-26 DIAGNOSIS — M549 Dorsalgia, unspecified: Secondary | ICD-10-CM | POA: Insufficient documentation

## 2018-11-26 DIAGNOSIS — Z91041 Radiographic dye allergy status: Secondary | ICD-10-CM | POA: Insufficient documentation

## 2018-11-26 DIAGNOSIS — O4703 False labor before 37 completed weeks of gestation, third trimester: Secondary | ICD-10-CM

## 2018-11-26 DIAGNOSIS — O9989 Other specified diseases and conditions complicating pregnancy, childbirth and the puerperium: Secondary | ICD-10-CM | POA: Insufficient documentation

## 2018-11-26 DIAGNOSIS — Z8249 Family history of ischemic heart disease and other diseases of the circulatory system: Secondary | ICD-10-CM | POA: Insufficient documentation

## 2018-11-26 DIAGNOSIS — Z3A32 32 weeks gestation of pregnancy: Secondary | ICD-10-CM

## 2018-11-26 DIAGNOSIS — Z20828 Contact with and (suspected) exposure to other viral communicable diseases: Secondary | ICD-10-CM | POA: Insufficient documentation

## 2018-11-26 DIAGNOSIS — R109 Unspecified abdominal pain: Secondary | ICD-10-CM | POA: Insufficient documentation

## 2018-11-26 DIAGNOSIS — O26873 Cervical shortening, third trimester: Secondary | ICD-10-CM | POA: Insufficient documentation

## 2018-11-26 DIAGNOSIS — Z87891 Personal history of nicotine dependence: Secondary | ICD-10-CM | POA: Insufficient documentation

## 2018-11-26 DIAGNOSIS — O26893 Other specified pregnancy related conditions, third trimester: Secondary | ICD-10-CM | POA: Insufficient documentation

## 2018-11-26 HISTORY — DX: Unspecified osteoarthritis, unspecified site: M19.90

## 2018-11-26 MED ORDER — NIFEDIPINE 10 MG PO CAPS
10.0000 mg | ORAL_CAPSULE | ORAL | Status: AC | PRN
  Administered 2018-11-27 (×4): 10 mg via ORAL
  Filled 2018-11-26 (×4): qty 1

## 2018-11-26 NOTE — MAU Note (Signed)
Pt reports to MAU c/o ctx every min. Pt denies bleeding or LOF. +FM.

## 2018-11-26 NOTE — MAU Provider Note (Signed)
History     CSN: [id number]  Arrival date and time: 11/26/18 2210   First Provider Initiated Contact with Patient 11/26/18 2335      Chief Complaint  Patient presents with  . Contractions    Betty Harpslexis Peavy is a 18 y.o. G1P0 at 5012w6d who receives care at [facility].  She presents today for Contractions that started around 10pm.  She states the pain originally started as back pain and she thought it was related to position.  She states the contractions have gotten closer together, but have not increased in intensity.  She states she has not tried to take anything for the contractions and rates them a 5/10.  She reports sexual activity yesterday and states she was not told about pelvic rest. She reports prometrium usage every night without issues, but did not use it tonight.  She endorses fetal movement and denies LoF, discharge, and bleeding.  Patient states she has had at least 4 large bottles of water today.      OB History    Gravida  1   Para  0   Term  0   Preterm  0   AB  0   Living  0     SAB  0   TAB  0   Ectopic  0   Multiple  0   Live Births  0           Past Medical History:  Diagnosis Date  . Allergy   . Anxiety   . Arthritis   . Asthma   . 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 Bipolar 1 disorder (HCC)   . Depression   . Eating disorder    Pt reported she used to binge eat  . Headache   . Vision abnormalities    Pt wears glasses    History reviewed. No pertinent surgical history.  Family History  Problem Relation Age of Onset  . Asthma Mother   . Osteoarthritis Mother   . Diabetes Father   . Hypertension Father   . Crohn's disease Father   . Cancer Maternal Grandmother   . Blindness Paternal Uncle   . Deafness Paternal Uncle     Social History   Tobacco Use  . Smoking status: Former Games developermoker  . Smokeless tobacco: Never Used  . Tobacco comment: stopped when she had upt+  Substance Use Topics   . Alcohol use: No    Alcohol/week: 0.0 standard drinks  . Drug use: No    Allergies:  Allergies  Allergen Reactions  . Ivp Dye [Iodinated Diagnostic Agents] Anaphylaxis  . Bee Venom     Skin reaction   . Eggs Or Egg-Derived Products Nausea Only  . Pollen Extract Other (See Comments)    unknown    Medications Prior to Admission  Medication Sig Dispense Refill Last Dose  . Blood Pressure Monitor MISC For regular home bp monitoring during pregnancy 1 each 0 Past Week at Unknown time  . metroNIDAZOLE (FLAGYL) 500 MG tablet Take 1 tablet (500 mg total) by mouth 2 (two) times daily. 14 tablet 0 Past Week at Unknown time  . Prenatal Vit-Fe Fumarate-FA (MULTIVITAMIN-PRENATAL) 27-0.8 MG TABS tablet Take 1 tablet by mouth daily at 12 noon.   11/26/2018 at Unknown time  . progesterone (PROMETRIUM) 200 MG capsule Place 1 capsule (200 mg total) vaginally at bedtime. 30 capsule 5 11/25/2018 at Unknown time  . acetaminophen (TYLENOL) 500 MG  tablet Take 500 mg by mouth as needed.   Unknown at Unknown time    Review of Systems  Constitutional: Negative for chills and fever.  Respiratory: Negative for cough and shortness of breath.   Gastrointestinal: Positive for abdominal pain, constipation and vomiting. Negative for diarrhea and nausea.  Genitourinary: Positive for vaginal discharge. Negative for difficulty urinating, dysuria and vaginal bleeding.  Musculoskeletal: Positive for back pain.  Neurological: Negative for dizziness, light-headedness and headaches.   Physical Exam   Blood pressure 133/78, pulse (!) 106, temperature 98.3 F (36.8 C), temperature source Oral, resp. rate 20, last menstrual period 04/17/2018.  Physical Exam  Constitutional: She is oriented to person, place, and time. She appears well-developed and well-nourished. No distress.  HENT:  Head: Normocephalic and atraumatic.  Eyes: Conjunctivae are normal.  Neck: Normal range of motion.  Cardiovascular: Normal rate,  regular rhythm and normal heart sounds.  Respiratory: Effort normal.  GI: Soft.  Genitourinary: Cervix exhibits no discharge.    No vaginal discharge or bleeding.  No bleeding in the vagina.    Genitourinary Comments: Sterile Speculum Exam: -Normal External Genitalia: Non tender, no apparent discharge at introitus.  -Vaginal Vault: Pink mucosa with good rugae. No apparent discharge noted -Cervix:Pink, no lesions, cysts, or polyps.  Appears closed. No active bleeding, but white mucoid discharge from os noted -Bimanual Exam: Dilation: Closed Exam by:: Betty Harrison, CNM    Musculoskeletal: Normal range of motion.  Neurological: She is alert and oriented to person, place, and time.  Skin: Skin is warm and dry.  Psychiatric: She has a normal mood and affect. Her behavior is normal.    Fetal Assessment 155 bpm, Mod Var, -Decels, +Accels Toco: Q1-58min, palpates moderate   MAU Course   Results for orders placed or performed during the hospital encounter of 11/26/18 (from the past 24 hour(s))  Urinalysis, Routine w reflex microscopic     Status: Abnormal   Collection Time: 11/27/18 12:07 AM  Result Value Ref Range   Color, Urine YELLOW YELLOW   APPearance CLOUDY (A) CLEAR   Specific Gravity, Urine 1.015 1.005 - 1.030   pH 7.0 5.0 - 8.0   Glucose, UA NEGATIVE NEGATIVE mg/dL   Hgb urine dipstick NEGATIVE NEGATIVE   Bilirubin Urine NEGATIVE NEGATIVE   Ketones, ur 20 (A) NEGATIVE mg/dL   Protein, ur 30 (A) NEGATIVE mg/dL   Nitrite NEGATIVE NEGATIVE   Leukocytes,Ua NEGATIVE NEGATIVE   RBC / HPF 0-5 0 - 5 RBC/hpf   WBC, UA 21-50 0 - 5 WBC/hpf   Bacteria, UA RARE (A) NONE SEEN   Squamous Epithelial / LPF 0-5 0 - 5   Mucus PRESENT    Amorphous Crystal PRESENT    No results found.  MDM PE Labs: UA EFM  Assessment and Plan  18 year old G1P0  SIUP at 31.6weeks Cat I FT Contractions Known Short Cervix  -Extensive discussion regarding importance of pelvic rest with short cervix.   -Exam findings discussed. -Reassured that cervix is closed. -Will initiate Procardia protocol  -NST reactive -Patient instructed to leave urine for evaluation.   Maryann Conners MSN, CNM 11/26/2018, 11:36 PM   Reassessment (1:53 AM)  -UA returns with ketones and IV started at 0130. -Patient reports pain not improving despite procardia. -Contractions appear more frequent -Will order flexeril 10mg  now and reassess.  Reassessment (2:57 AM)  -Patient reports no improvement in pain. -2nd bolus started -Fentanyl 7mcg ordered. -Dr. Eddie North consulted and informed of patient status. Advised to  give terbutaline and reassess. -Terbutaline .25mg  ordered  Reassessment (4:10 AM)  -Contractions subsided per toco. -Patient continues to report discomfort and contractions. -No contractions palpated.  -VE remains unchanged. -Will discharge to home with precautions. -Reiterated need for pelvic rest, to patient and FOB, until at least 36+ weeks. -Instructed to take Prometrium at home. -Patient without questions or concerns. -Encouraged to call or return to MAU if symptoms worsen or with the onset of new symptoms. -Discharged to home in stable condition.  Cherre RobinsJessica L Caden Fatica MSN, CNM 11/27/2018

## 2018-11-27 ENCOUNTER — Ambulatory Visit (INDEPENDENT_AMBULATORY_CARE_PROVIDER_SITE_OTHER): Payer: Medicaid Other | Admitting: Obstetrics and Gynecology

## 2018-11-27 ENCOUNTER — Inpatient Hospital Stay (HOSPITAL_COMMUNITY): Payer: Medicaid Other

## 2018-11-27 ENCOUNTER — Encounter: Payer: Self-pay | Admitting: Obstetrics and Gynecology

## 2018-11-27 ENCOUNTER — Other Ambulatory Visit: Payer: Self-pay | Admitting: Obstetrics and Gynecology

## 2018-11-27 ENCOUNTER — Inpatient Hospital Stay (EMERGENCY_DEPARTMENT_HOSPITAL)
Admission: AD | Admit: 2018-11-27 | Discharge: 2018-11-27 | Disposition: A | Discharge status: Home / Self Care | Payer: Medicaid Other | Source: Home / Self Care | Attending: Obstetrics and Gynecology | Admitting: Obstetrics and Gynecology

## 2018-11-27 ENCOUNTER — Other Ambulatory Visit: Payer: Self-pay

## 2018-11-27 ENCOUNTER — Inpatient Hospital Stay
Admission: EM | Admit: 2018-11-27 | Discharge: 2018-11-28 | DRG: 832 | Disposition: A | Discharge status: Home / Self Care | Payer: Medicaid Other | Attending: Obstetrics and Gynecology | Admitting: Obstetrics and Gynecology

## 2018-11-27 ENCOUNTER — Encounter (HOSPITAL_COMMUNITY): Payer: Self-pay

## 2018-11-27 VITALS — BP 117/80 | HR 112 | Wt 141.2 lb

## 2018-11-27 DIAGNOSIS — Z20828 Contact with and (suspected) exposure to other viral communicable diseases: Secondary | ICD-10-CM | POA: Insufficient documentation

## 2018-11-27 DIAGNOSIS — Z3A31 31 weeks gestation of pregnancy: Secondary | ICD-10-CM | POA: Diagnosis not present

## 2018-11-27 DIAGNOSIS — Z3A32 32 weeks gestation of pregnancy: Secondary | ICD-10-CM

## 2018-11-27 DIAGNOSIS — O26873 Cervical shortening, third trimester: Secondary | ICD-10-CM

## 2018-11-27 DIAGNOSIS — O26893 Other specified pregnancy related conditions, third trimester: Secondary | ICD-10-CM | POA: Diagnosis not present

## 2018-11-27 DIAGNOSIS — M549 Dorsalgia, unspecified: Secondary | ICD-10-CM | POA: Diagnosis not present

## 2018-11-27 DIAGNOSIS — O4700 False labor before 37 completed weeks of gestation, unspecified trimester: Secondary | ICD-10-CM

## 2018-11-27 DIAGNOSIS — N883 Incompetence of cervix uteri: Secondary | ICD-10-CM

## 2018-11-27 DIAGNOSIS — Z91041 Radiographic dye allergy status: Secondary | ICD-10-CM | POA: Insufficient documentation

## 2018-11-27 DIAGNOSIS — O4703 False labor before 37 completed weeks of gestation, third trimester: Secondary | ICD-10-CM | POA: Diagnosis not present

## 2018-11-27 DIAGNOSIS — R109 Unspecified abdominal pain: Secondary | ICD-10-CM | POA: Diagnosis present

## 2018-11-27 DIAGNOSIS — Z3689 Encounter for other specified antenatal screening: Secondary | ICD-10-CM | POA: Diagnosis not present

## 2018-11-27 DIAGNOSIS — O9989 Other specified diseases and conditions complicating pregnancy, childbirth and the puerperium: Secondary | ICD-10-CM | POA: Diagnosis not present

## 2018-11-27 DIAGNOSIS — O479 False labor, unspecified: Secondary | ICD-10-CM

## 2018-11-27 DIAGNOSIS — Z833 Family history of diabetes mellitus: Secondary | ICD-10-CM | POA: Diagnosis not present

## 2018-11-27 DIAGNOSIS — Z8249 Family history of ischemic heart disease and other diseases of the circulatory system: Secondary | ICD-10-CM | POA: Insufficient documentation

## 2018-11-27 DIAGNOSIS — Z87891 Personal history of nicotine dependence: Secondary | ICD-10-CM | POA: Insufficient documentation

## 2018-11-27 LAB — URINALYSIS, ROUTINE W REFLEX MICROSCOPIC
Bilirubin Urine: NEGATIVE
Bilirubin Urine: NEGATIVE
Glucose, UA: NEGATIVE mg/dL
Glucose, UA: NEGATIVE mg/dL
Hgb urine dipstick: NEGATIVE
Ketones, ur: 20 mg/dL — AB
Ketones, ur: NEGATIVE mg/dL
Leukocytes,Ua: NEGATIVE
Nitrite: NEGATIVE
Nitrite: NEGATIVE
Protein, ur: 30 mg/dL — AB
Protein, ur: 30 mg/dL — AB
Specific Gravity, Urine: 1.009 (ref 1.005–1.030)
Specific Gravity, Urine: 1.015 (ref 1.005–1.030)
pH: 7 (ref 5.0–8.0)
pH: 9 — ABNORMAL HIGH (ref 5.0–8.0)

## 2018-11-27 LAB — CBC
HCT: 30.7 % — ABNORMAL LOW (ref 36.0–46.0)
Hemoglobin: 10.4 g/dL — ABNORMAL LOW (ref 12.0–15.0)
MCH: 28.6 pg (ref 26.0–34.0)
MCHC: 33.9 g/dL (ref 30.0–36.0)
MCV: 84.3 fL (ref 80.0–100.0)
Platelets: 224 10*3/uL (ref 150–400)
RBC: 3.64 MIL/uL — ABNORMAL LOW (ref 3.87–5.11)
RDW: 11.6 % (ref 11.5–15.5)
WBC: 9.2 10*3/uL (ref 4.0–10.5)
nRBC: 0 % (ref 0.0–0.2)

## 2018-11-27 LAB — WET PREP, GENITAL
Sperm: NONE SEEN
Trich, Wet Prep: NONE SEEN
Yeast Wet Prep HPF POC: NONE SEEN

## 2018-11-27 MED ORDER — CYCLOBENZAPRINE HCL 10 MG PO TABS
10.0000 mg | ORAL_TABLET | Freq: Once | ORAL | Status: AC
  Administered 2018-11-27: 10 mg via ORAL
  Filled 2018-11-27: qty 1

## 2018-11-27 MED ORDER — LIDOCAINE HCL (PF) 1 % IJ SOLN: 30.0000 mL | INTRAMUSCULAR | Status: DC | PRN

## 2018-11-27 MED ORDER — BUTORPHANOL TARTRATE 1 MG/ML IJ SOLN
1.0000 mg | Freq: Once | INTRAMUSCULAR | Status: AC
  Administered 2018-11-27: 1 mg via INTRAVENOUS
  Filled 2018-11-27: qty 1

## 2018-11-27 MED ORDER — MAGNESIUM SULFATE BOLUS VIA INFUSION
4.0000 g | Freq: Once | INTRAVENOUS | Status: AC
  Administered 2018-11-27: 4 g via INTRAVENOUS
  Filled 2018-11-27: qty 500

## 2018-11-27 MED ORDER — LACTATED RINGERS IV BOLUS
1000.0000 mL | Freq: Once | INTRAVENOUS | Status: AC
  Administered 2018-11-27: 1000 mL via INTRAVENOUS

## 2018-11-27 MED ORDER — ACETAMINOPHEN 325 MG PO TABS
650.0000 mg | ORAL_TABLET | ORAL | Status: DC | PRN
  Administered 2018-11-27: 23:00:00 650 mg via ORAL
  Filled 2018-11-27: qty 2

## 2018-11-27 MED ORDER — MAGNESIUM SULFATE 40 G IN LACTATED RINGERS - SIMPLE
2.0000 g/h | Freq: Once | INTRAVENOUS | Status: DC
  Filled 2018-11-27: qty 500

## 2018-11-27 MED ORDER — SODIUM CHLORIDE 0.9 % IV SOLN
1.0000 g | INTRAVENOUS | Status: AC
  Administered 2018-11-27: 23:00:00 1 g via INTRAVENOUS
  Filled 2018-11-27: qty 1000

## 2018-11-27 MED ORDER — BUTORPHANOL TARTRATE 1 MG/ML IJ SOLN: 2.0000 mg | INTRAMUSCULAR | Status: DC | PRN

## 2018-11-27 MED ORDER — MAGNESIUM SULFATE 4 GM/100ML IV SOLN
4.0000 g | Freq: Once | INTRAVENOUS | Status: DC
  Filled 2018-11-27: qty 100

## 2018-11-27 MED ORDER — SOD CITRATE-CITRIC ACID 500-334 MG/5ML PO SOLN: 30.0000 mL | ORAL | Status: DC | PRN

## 2018-11-27 MED ORDER — LACTATED RINGERS IV SOLN: 500.0000 mL | INTRAVENOUS | Status: DC | PRN

## 2018-11-27 MED ORDER — MAGNESIUM SULFATE 2 GM/50ML IV SOLN
2.0000 g | Freq: Once | INTRAVENOUS | Status: DC
  Filled 2018-11-27: qty 50

## 2018-11-27 MED ORDER — BETAMETHASONE SOD PHOS & ACET 6 (3-3) MG/ML IJ SUSP
12.0000 mg | Freq: Once | INTRAMUSCULAR | Status: DC
  Filled 2018-11-27: qty 2

## 2018-11-27 MED ORDER — BETAMETHASONE SOD PHOS & ACET 6 (3-3) MG/ML IJ SUSP
12.0000 mg | Freq: Once | INTRAMUSCULAR | Status: AC
  Administered 2018-11-27: 12 mg via INTRAMUSCULAR

## 2018-11-27 MED ORDER — TERBUTALINE SULFATE 1 MG/ML IJ SOLN
0.2500 mg | Freq: Once | INTRAMUSCULAR | Status: AC
  Administered 2018-11-27: 03:00:00 0.25 mg via SUBCUTANEOUS

## 2018-11-27 MED ORDER — BETAMETHASONE SOD PHOS & ACET 6 (3-3) MG/ML IJ SUSP: 12.0000 mg | Freq: Once | INTRAMUSCULAR | Status: DC

## 2018-11-27 MED ORDER — SODIUM CHLORIDE 0.9 % IV SOLN
2.0000 g | Freq: Four times a day (QID) | INTRAVENOUS | Status: DC
  Administered 2018-11-27: 2 g via INTRAVENOUS
  Filled 2018-11-27: qty 2000

## 2018-11-27 MED ORDER — CALCIUM GLUCONATE-NACL 1-0.675 GM/50ML-% IV SOLN
1.0000 g | Freq: Once | INTRAVENOUS | Status: DC
  Filled 2018-11-27: qty 50

## 2018-11-27 MED ORDER — FENTANYL CITRATE (PF) 100 MCG/2ML IJ SOLN
50.0000 ug | Freq: Once | INTRAMUSCULAR | Status: AC
  Administered 2018-11-27: 50 ug via INTRAVENOUS
  Filled 2018-11-27: qty 2

## 2018-11-27 MED ORDER — METRONIDAZOLE 500 MG PO TABS
500.0000 mg | ORAL_TABLET | Freq: Two times a day (BID) | ORAL | Status: DC
  Administered 2018-11-27 – 2018-11-28 (×2): 500 mg via ORAL
  Filled 2018-11-27 (×2): qty 1

## 2018-11-27 MED ORDER — OXYTOCIN BOLUS FROM INFUSION: 500.0000 mL | Freq: Once | INTRAVENOUS | Status: DC

## 2018-11-27 MED ORDER — BUTORPHANOL TARTRATE 1 MG/ML IJ SOLN
1.0000 mg | INTRAMUSCULAR | Status: DC | PRN
  Administered 2018-11-28: 10:00:00 1 mg via INTRAVENOUS
  Filled 2018-11-27: qty 1

## 2018-11-27 MED ORDER — MAGNESIUM SULFATE 40 G IN LACTATED RINGERS - SIMPLE
2.0000 g/h | INTRAVENOUS | Status: DC
  Administered 2018-11-27: 2 g/h via INTRAVENOUS

## 2018-11-27 MED ORDER — ONDANSETRON HCL 4 MG/2ML IJ SOLN: 4.0000 mg | Freq: Four times a day (QID) | INTRAMUSCULAR | Status: DC | PRN

## 2018-11-27 MED ORDER — NIFEDIPINE 10 MG PO CAPS
10.0000 mg | ORAL_CAPSULE | ORAL | Status: AC
  Administered 2018-11-27 (×3): 10 mg via ORAL
  Filled 2018-11-27 (×3): qty 1

## 2018-11-27 MED ORDER — TERBUTALINE SULFATE 1 MG/ML IJ SOLN
INTRAMUSCULAR | Status: AC
  Administered 2018-11-27: 03:00:00 0.25 mg via SUBCUTANEOUS
  Filled 2018-11-27: qty 1

## 2018-11-27 MED ORDER — OXYTOCIN 40 UNITS IN NORMAL SALINE INFUSION - SIMPLE MED: 2.5000 [IU]/h | INTRAVENOUS | Status: DC

## 2018-11-27 MED ORDER — MAGNESIUM SULFATE 40 G IN LACTATED RINGERS - SIMPLE: 2.0000 g/h | INTRAVENOUS | Status: DC

## 2018-11-27 MED ORDER — TERBUTALINE SULFATE 1 MG/ML IJ SOLN
0.2500 mg | Freq: Once | INTRAMUSCULAR | Status: AC
  Administered 2018-11-27: 23:00:00 0.25 mg via SUBCUTANEOUS
  Filled 2018-11-27: qty 1

## 2018-11-27 MED ORDER — LACTATED RINGERS IV SOLN
INTRAVENOUS | Status: DC
  Administered 2018-11-28: 04:00:00 via INTRAVENOUS

## 2018-11-27 MED ORDER — LACTATED RINGERS IV SOLN: INTRAVENOUS | Status: DC
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
## 2018-11-27 NOTE — Progress Notes (Signed)
Patient ID: Betty HarpsAlexis Zenon, female   DOB: Oct 05, 2000, 18 y.o.   MRN: [id number]    LOW-RISK PREGNANCY VISIT Patient name: Betty Harrison MRN [id number]  Date of birth: Oct 05, 2000 Chief Complaint: persistent uncomfortable contractons q 6-7 mins   [facility]  History of Present Illness:   Betty Harrison is a 18 y.o. G1P0000 female at 6746w0d with an Estimated Date of Delivery: 01/22/19 being seen today for ongoing management of a W/I PER NURSE low-risk pregnancy that is notable for short cervix length , on Prometrium VC patient Reportscramping all throughout stomach, has so many to the point not being able to speak during contractions. ( Note : pt is autistic, so sensory perception and explanations are her own, I am not familiar with pt prior to today) Went to MAU last night, check to se if dilating only dilated  A half centimeter.  Was give injection ?terbutaline)that caused increased heart rate,* and procardia x 4     to stop contractions. Has chronic headaches, nausea and vomiting, with lightheadedness. Is only taking progesterone. Says had some small contractions 5-7 weeks ago. Today she reports contractions since 9 pm 11/26/2018. Contractions: Irregular. Vag. Bleeding: None.  Movement: Present. denies leaking of fluid. Review of Systems:   Pertinent items are noted in HPI Denies abnormal vaginal discharge w/ itching/odor/irritation, visual changes, shortness of breath, chest pain,or problems with urination or bowel movements unless otherwise stated above. Pertinent History Reviewed:  Reviewed past medical,surgical, social, obstetrical and family history.  Reviewed problem list, medications and allergies. Physical Assessment:   Vitals:   11/27/18 1117  BP: 117/80  Pulse: (!) 112  Weight: 141 lb 3.2 oz (64 kg)  Body mass index is 24.24 kg/m.        Physical Examination:   General appearance: Well appearing, and in no distress  Mental status: Alert, oriented to  person, place, and time  Skin: Warm & dry  Cardiovascular: Normal heart rate noted  Respiratory: Normal respiratory effort, no distress  Abdomen: Soft, gravid, moderate discomfort, good uterine relaxation between contractions.  Pelvic: Cervical exam performed posterior long, closed, high  FFN collected prior to exam, knowing that pt had exam this morning early at MAU., so negative would be helpful, positive would be uninterpretable.        Extremities: Edema: Trace  Fetal Status:     Movement: Present    Results for orders placed or performed during the hospital encounter of 11/26/18 (from the past 24 hour(s))  Urinalysis, Routine w reflex microscopic   Collection Time: 11/27/18 12:07 AM  Result Value Ref Range   Color, Urine YELLOW YELLOW   APPearance CLOUDY (A) CLEAR   Specific Gravity, Urine 1.015 1.005 - 1.030   pH 7.0 5.0 - 8.0   Glucose, UA NEGATIVE NEGATIVE mg/dL   Hgb urine dipstick NEGATIVE NEGATIVE   Bilirubin Urine NEGATIVE NEGATIVE   Ketones, ur 20 (A) NEGATIVE mg/dL   Protein, ur 30 (A) NEGATIVE mg/dL   Nitrite NEGATIVE NEGATIVE   Leukocytes,Ua NEGATIVE NEGATIVE   RBC / HPF 0-5 0 - 5 RBC/hpf   WBC, UA 21-50 0 - 5 WBC/hpf   Bacteria, UA RARE (A) NONE SEEN   Squamous Epithelial / LPF 0-5 0 - 5   Mucus PRESENT    Amorphous Crystal PRESENT     Assessment & Plan:  1) Low-risk pregnancy G1P0000 at 7446w0d with an Estimated Date of Delivery: 01/22/19   2) preterm uterine contractions, without cervical change, causing signifiicant discomfort  Meds: No orders of the defined types were placed in this encounter.  Labs/procedures today: NST, FFN collected knowing pt was examined 8 hrs prior. Fundal high 32  Plan:    Rx Betamethazone given in office Will refer back to MAU for u/s today, for CL, and presentation, and probable dose of Procardia, may need Outpt procardia as comfort measure   Reviewed: Preterm labor symptoms and general obstetric precautions including but not  limited to vaginal bleeding, contractions, leaking of fluid and fetal movement were reviewed in detail with the patient.   Follow-up: No follow-ups on file.report given to MAU triage.  No orders of the defined types were placed in this encounter.  By signing my name below, I, Samul Dada, attest that this documentation has been prepared under the direction and in the presence of Jonnie Kind, MD. Electronically Signed: [place]. 11/27/18. 11:33 AM.  I personally performed the services described in this documentation, which was SCRIBED in my presence. The recorded information has been reviewed and considered accurate. It has been edited as necessary during review. Jonnie Kind, MD

## 2018-11-27 NOTE — MAU Provider Note (Signed)
History     CSN: [id number]  Arrival date and time: 11/27/18 1407   First Provider Initiated Contact with Patient 11/27/18 1526      Chief Complaint  Patient presents with  . Contractions   Betty Harrison is a 18 y.o. G1P0000 at [redacted]w[redacted]d who presents today with contractions. She states that they started last night around 2100. She was seen here over night and had procardia, fluids and terb. She states that by the time she got home the contractions had stopped. However, they started back around 1000 am today. She was seen in the office today around 1100am, and was sent here for evaluation. Cervix was closed at that time. She was given first dose of BMZ at that time. She denies any VB or LOF. She reports normal fetal movement.   Pelvic Pain The patient's primary symptoms include pelvic pain. The patient's pertinent negatives include no vaginal discharge. This is a new problem. The current episode started yesterday. The problem occurs intermittently. The problem has been unchanged. The problem affects both sides. She is pregnant. Associated symptoms include nausea and vomiting (x 1 yesterday due to pain). Pertinent negatives include no chills, dysuria, fever or frequency. The vaginal discharge was normal. There has been no bleeding. Sexual activity: patient denies intercourse in the last 24 hours.     OB History    Gravida  1   Para  0   Term  0   Preterm  0   AB  0   Living  0     SAB  0   TAB  0   Ectopic  0   Multiple  0   Live Births  0           Past Medical History:  Diagnosis Date  . Allergy   . Anxiety   . Arthritis   . Asthma   . 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 Bipolar 1 disorder ([place])   . Depression   . Eating disorder    Pt reported she used to binge eat  . Headache   . Vision abnormalities    Pt wears glasses    History reviewed. No pertinent surgical history.  Family History  Problem Relation Age of  Onset  . Asthma Mother   . Osteoarthritis Mother   . Diabetes Father   . Hypertension Father   . Crohn's disease Father   . Cancer Maternal Grandmother   . Blindness Paternal Uncle   . Deafness Paternal Uncle     Social History   Tobacco Use  . Smoking status: Former Research scientist (life sciences)  . Smokeless tobacco: Never Used  . Tobacco comment: stopped when she had upt+  Substance Use Topics  . Alcohol use: No    Alcohol/week: 0.0 standard drinks  . Drug use: No    Allergies:  Allergies  Allergen Reactions  . Ivp Dye [Iodinated Diagnostic Agents] Anaphylaxis  . Bee Venom     Skin reaction   . Eggs Or Egg-Derived Products Nausea Only  . Pollen Extract Other (See Comments)    unknown    Medications Prior to Admission  Medication Sig Dispense Refill Last Dose  . Blood Pressure Monitor MISC For regular home bp monitoring during pregnancy 1 each 0 Past Week at Unknown time  . Prenatal Vit-Fe Fumarate-FA (MULTIVITAMIN-PRENATAL) 27-0.8 MG TABS tablet Take 1 tablet by mouth daily at 12 noon.   Past Week at Unknown time  .  progesterone (PROMETRIUM) 200 MG capsule Place 1 capsule (200 mg total) vaginally at bedtime. 30 capsule 5 Past Week at Unknown time  . acetaminophen (TYLENOL) 500 MG tablet Take 500 mg by mouth as needed.   More than a month at Unknown time    Review of Systems  Constitutional: Negative for chills and fever.  Gastrointestinal: Positive for nausea and vomiting (x 1 yesterday due to pain).  Genitourinary: Positive for pelvic pain. Negative for dysuria, frequency, vaginal bleeding and vaginal discharge.   Physical Exam   Blood pressure 130/76, pulse (!) 101, temperature 98 F (36.7 C), temperature source Oral, resp. rate (!) 25, weight 64 kg, last menstrual period 04/17/2018, SpO2 97 %.  Physical Exam  Nursing note and vitals reviewed. Constitutional: She is oriented to person, place, and time. She appears well-developed and well-nourished. No distress.  HENT:  Head:  Normocephalic.  Cardiovascular: Normal rate.  Respiratory: Effort normal.  GI: Soft. There is no abdominal tenderness. There is no rebound.  Genitourinary:    Genitourinary Comments:  Dilation: 1 Effacement (%): 60 Station: Ballotable Exam by:: Thressa ShellerHeather Hogan, CNM    Neurological: She is alert and oriented to person, place, and time.  Skin: Skin is warm and dry.  Psychiatric: She has a normal mood and affect.    NST:  Baseline: 155 Variability: moderate Accels: 15x15 Decels: none Toco: q2 mins  Results for orders placed or performed during the hospital encounter of 11/27/18 (from the past 24 hour(s))  Wet prep, genital     Status: Abnormal   Collection Time: 11/27/18  3:39 PM   Specimen: Vaginal  Result Value Ref Range   Yeast Wet Prep HPF POC NONE SEEN NONE SEEN   Trich, Wet Prep NONE SEEN NONE SEEN   Clue Cells Wet Prep HPF POC PRESENT (A) NONE SEEN   WBC, Wet Prep HPF POC MANY (A) NONE SEEN   Sperm NONE SEEN   Urinalysis, Routine w reflex microscopic     Status: Abnormal   Collection Time: 11/27/18  4:24 PM  Result Value Ref Range   Color, Urine YELLOW YELLOW   APPearance CLEAR CLEAR   Specific Gravity, Urine 1.009 1.005 - 1.030   pH 9.0 (H) 5.0 - 8.0   Glucose, UA NEGATIVE NEGATIVE mg/dL   Hgb urine dipstick MODERATE (A) NEGATIVE   Bilirubin Urine NEGATIVE NEGATIVE   Ketones, ur NEGATIVE NEGATIVE mg/dL   Protein, ur 30 (A) NEGATIVE mg/dL   Nitrite NEGATIVE NEGATIVE   Leukocytes,Ua TRACE (A) NEGATIVE   RBC / HPF 0-5 0 - 5 RBC/hpf   WBC, UA 11-20 0 - 5 WBC/hpf   Bacteria, UA RARE (A) NONE SEEN   Squamous Epithelial / LPF 0-5 0 - 5   Mucus PRESENT    [place] Mfm Ob Limited  Result Date: 11/27/2018 ----------------------------------------------------------------------  OBSTETRICS REPORT                       (Signed Final 11/27/2018 05:05 pm) ---------------------------------------------------------------------- Patient Info  ID #:       [id number]                           D.O.B.:  2000-09-11 (18 yrs)  Name:       Betty Harrison                    Visit Date: 11/27/2018 03:38 pm ---------------------------------------------------------------------- Performed By  Performed By:  Devin Vics             Ref. Address:      [address]                    [address]                                                              [address] [place],                                                              [place] [id number]  Attending:        Noralee Spaceavi Shankar MD        [facility]  Referred By:      Armando ReichertHEATHER D                    HOGAN CNM ---------------------------------------------------------------------- Orders   #  Description                          Code         Ordered By   1  US MFM OB LIMITED                    [id number]     HEATHER HOGAN  ----------------------------------------------------------------------   #  Order #                    Accession #                 Episode #   1  [id number]                  [id number]                  [id number]  ---------------------------------------------------------------------- Indications   Preterm contractions                           O47.00   [redacted] weeks gestation of pregnancy                Z3A.32  ---------------------------------------------------------------------- Vital Signs  Weight (lb): 141                               Height:        5'4"  BMI:         24.2 ---------------------------------------------------------------------- Fetal Evaluation  Num Of Fetuses:          1  Fetal Heart Rate(bpm):   154  Cardiac Activity:        Observed  Presentation:            Cephalic  Placenta:                Anterior  Amniotic Fluid  AFI FV:      Within normal limits  AFI Sum(cm)     %Tile       Largest Pocket(cm)  15.61           56          5.73  RUQ(cm)       RLQ(cm)       LUQ(cm)        LLQ(cm)  2.6           5.73          3.91           3.37  ---------------------------------------------------------------------- OB History  Gravidity:    1 ---------------------------------------------------------------------- Gestational Age  LMP:           32w 0d        Date:  04/17/18                 EDD:   01/22/19  Best:          Armida Sans 0d     Det. By:  LMP  (04/17/18)          EDD:   01/22/19 ---------------------------------------------------------------------- Cervix Uterus Adnexa  Cervix  Not visualized (advanced GA >24wks) ---------------------------------------------------------------------- Impression  Patient is being evaluated in the MAU for uterine contractions.  A limited ultrasound study was performed. Amniotic fluid is  normal and good fetal activity is seen. We could not assess  the cervix on transabdominal scan. On vaginal exam  performed at the office, the cervix was closed. Transvaginal  assessment of cervix is less-likely to guide [place] on the  management.  Repeat vaginal examination may be performed to decide on  discharge management. ----------------------------------------------------------------------                  Noralee Space, MD Electronically Signed Final Report   11/27/2018 05:05 pm ----------------------------------------------------------------------    MAU Course  Procedures  MDM BMZ #1 11/27/2018 at 11:57am FFN collected at FT today as well. No result in computer at this time. Patient states that Dr. Emelda Fear did not send it with her.   5:19 PM CW Dr. Adrian Blackwater, does not need cervical length. Start Mag and he will call NICU about admission.   5:21 PM Dr. Adrian Blackwater spoke with NICU. Will need to transfer.   5:47 PM Dr. Adrian Blackwater spoke with Dr. Dalbert Garnet at [facility] and they accept transfer.  Assessment and Plan  Preterm labor Mag started Has had BMZ GBS collected  Transfer to [facility] for NICU care  Thressa Sheller DNP, CNM  11/27/18  7:58 PM

## 2018-11-27 NOTE — Addendum Note (Signed)
Addended by: Octaviano Glow on: 11/27/2018 01:39 PM   Modules accepted: Orders

## 2018-11-27 NOTE — MAU Note (Addendum)
Pt states that she is having ctx's close together, but does not know how close.   Pt reports shooting pains down her right leg.   Pt also reports heavy chest pain that's started around 1000-1030.  (Pt reports letting her OB know that this morning at her office visit. Pt states the doctor told her it could be from her anxiety.)   Reports +fm    Denies vaginal bleeding or LOF.

## 2018-11-27 NOTE — Discharge Instructions (Signed)
Braxton Hicks Contractions Contractions of the uterus can occur throughout pregnancy, but they are not always a sign that you are in labor. You may have practice contractions called Braxton Hicks contractions. These false labor contractions are sometimes confused with true labor. What are Braxton Hicks contractions? Braxton Hicks contractions are tightening movements that occur in the muscles of the uterus before labor. Unlike true labor contractions, these contractions do not result in opening (dilation) and thinning of the cervix. Toward the end of pregnancy (32-34 weeks), Braxton Hicks contractions can happen more often and may become stronger. These contractions are sometimes difficult to tell apart from true labor because they can be very uncomfortable. You should not feel embarrassed if you go to the hospital with false labor. Sometimes, the only way to tell if you are in true labor is for your health care provider to look for changes in the cervix. The health care provider will do a physical exam and may monitor your contractions. If you are not in true labor, the exam should show that your cervix is not dilating and your water has not broken. If there are no other health problems associated with your pregnancy, it is completely safe for you to be sent home with false labor. You may continue to have Braxton Hicks contractions until you go into true labor. How to tell the difference between true labor and false labor True labor  Contractions last 30-70 seconds.  Contractions become very regular.  Discomfort is usually felt in the top of the uterus, and it spreads to the lower abdomen and low back.  Contractions do not go away with walking.  Contractions usually become more intense and increase in frequency.  The cervix dilates and gets thinner. False labor  Contractions are usually shorter and not as strong as true labor contractions.  Contractions are usually irregular.  Contractions  are often felt in the front of the lower abdomen and in the groin.  Contractions may go away when you walk around or change positions while lying down.  Contractions get weaker and are shorter-lasting as time goes on.  The cervix usually does not dilate or become thin. Follow these instructions at home:   Take over-the-counter and prescription medicines only as told by your health care provider.  Keep up with your usual exercises and follow other instructions from your health care provider.  Eat and drink lightly if you think you are going into labor.  If Braxton Hicks contractions are making you uncomfortable: ? Change your position from lying down or resting to walking, or change from walking to resting. ? Sit and rest in a tub of warm water. ? Drink enough fluid to keep your urine pale yellow. Dehydration may cause these contractions. ? Do slow and deep breathing several times an hour.  Keep all follow-up prenatal visits as told by your health care provider. This is important. Contact a health care provider if:  You have a fever.  You have continuous pain in your abdomen. Get help right away if:  Your contractions become stronger, more regular, and closer together.  You have fluid leaking or gushing from your vagina.  You pass blood-tinged mucus (bloody show).  You have bleeding from your vagina.  You have low back pain that you never had before.  You feel your baby's head pushing down and causing pelvic pressure.  Your baby is not moving inside you as much as it used to. Summary  Contractions that occur before labor are   called Braxton Hicks contractions, false labor, or practice contractions.  Braxton Hicks contractions are usually shorter, weaker, farther apart, and less regular than true labor contractions. True labor contractions usually become progressively stronger and regular, and they become more frequent.  Manage discomfort from Braxton Hicks contractions  by changing position, resting in a warm bath, drinking plenty of water, or practicing deep breathing. This information is not intended to replace advice given to you by your health care provider. Make sure you discuss any questions you have with your health care provider. Document Released: 08/04/2016 Document Revised: 03/03/2017 Document Reviewed: 08/04/2016 Elsevier Patient Education  2020 Elsevier Inc.  

## 2018-11-27 NOTE — Progress Notes (Signed)
Note open, will attempt to close redundant note

## 2018-11-28 ENCOUNTER — Telehealth: Payer: Self-pay | Admitting: *Deleted

## 2018-11-28 ENCOUNTER — Telehealth: Payer: Self-pay | Admitting: Women's Health

## 2018-11-28 LAB — SARS CORONAVIRUS 2 (TAT 6-24 HRS): SARS Coronavirus 2: NEGATIVE

## 2018-11-28 LAB — GC/CHLAMYDIA PROBE AMP (~~LOC~~) NOT AT ARMC
Chlamydia: NEGATIVE
Neisseria Gonorrhea: NEGATIVE

## 2018-11-28 LAB — FETAL FIBRONECTIN: Fetal Fibronectin: NEGATIVE

## 2018-11-28 LAB — TYPE AND SCREEN
ABO/RH(D): A POS
Antibody Screen: NEGATIVE

## 2018-11-28 LAB — RPR: RPR Ser Ql: NONREACTIVE

## 2018-11-28 MED ORDER — OXYTOCIN 10 UNIT/ML IJ SOLN
INTRAMUSCULAR | Status: AC
  Filled 2018-11-28: qty 2

## 2018-11-28 MED ORDER — BETAMETHASONE SOD PHOS & ACET 6 (3-3) MG/ML IJ SUSP
INTRAMUSCULAR | Status: AC
  Administered 2018-11-28: 12 mg via INTRAMUSCULAR
  Filled 2018-11-28: qty 5

## 2018-11-28 MED ORDER — SODIUM CHLORIDE 0.9 % IV SOLN
INTRAVENOUS | Status: AC
  Administered 2018-11-28: 1 g via INTRAVENOUS
  Filled 2018-11-28: qty 1000

## 2018-11-28 MED ORDER — MISOPROSTOL 200 MCG PO TABS
ORAL_TABLET | ORAL | Status: AC
  Filled 2018-11-28: qty 4

## 2018-11-28 MED ORDER — SODIUM CHLORIDE 0.9 % IV SOLN
1.0000 g | INTRAVENOUS | Status: DC
  Administered 2018-11-28 (×2): 1 g via INTRAVENOUS
  Filled 2018-11-28: qty 1000

## 2018-11-28 MED ORDER — METRONIDAZOLE 500 MG PO TABS: 500.0000 mg | ORAL_TABLET | Freq: Two times a day (BID) | ORAL | 0 refills | Status: AC

## 2018-11-28 MED ORDER — LIDOCAINE HCL (PF) 1 % IJ SOLN
INTRAMUSCULAR | Status: AC
  Filled 2018-11-28: qty 30

## 2018-11-28 MED ORDER — AMMONIA AROMATIC IN INHA
RESPIRATORY_TRACT | Status: AC
  Filled 2018-11-28: qty 10

## 2018-11-28 MED ORDER — BETAMETHASONE SOD PHOS & ACET 6 (3-3) MG/ML IJ SUSP
12.0000 mg | Freq: Once | INTRAMUSCULAR | Status: AC
  Administered 2018-11-28: 12:00:00 12 mg via INTRAMUSCULAR

## 2018-11-28 NOTE — Discharge Instructions (Signed)
Preterm Labor and Birth Information °Pregnancy normally lasts 39-41 weeks. Preterm labor is when labor starts early. It starts before you have been pregnant for 37 whole weeks. °What are the risk factors for preterm labor? °Preterm labor is more likely to occur in women who: °· Have an infection while pregnant. °· Have a cervix that is short. °· Have gone into preterm labor before. °· Have had surgery on their cervix. °· Are younger than age 20. °· Are older than age 35. °· Are African American. °· Are pregnant with two or more babies. °· Take street drugs while pregnant. °· Smoke while pregnant. °· Do not gain enough weight while pregnant. °· Got pregnant right after another pregnancy. °What are the symptoms of preterm labor? °Symptoms of preterm labor include: °· Cramps. The cramps may feel like the cramps some women get during their period. The cramps may happen with watery poop (diarrhea). °· Pain in the belly (abdomen). °· Pain in the lower back. °· Regular contractions or tightening. It may feel like your belly is getting tighter. °· Pressure in the lower belly that seems to get stronger. °· More fluid (discharge) leaking from the vagina. The fluid may be watery or bloody. °· Water breaking. °Why is it important to notice signs of preterm labor? °Babies who are born early may not be fully developed. They have a higher chance for: °· Long-term heart problems. °· Long-term lung problems. °· Trouble controlling body systems, like breathing. °· Bleeding in the brain. °· A condition called cerebral palsy. °· Learning difficulties. °· Death. °These risks are highest for babies who are born before 34 weeks of pregnancy. °How is preterm labor treated? °Treatment depends on: °· How long you were pregnant. °· Your condition. °· The health of your baby. °Treatment may involve: °· Having a stitch (suture) placed in your cervix. When you give birth, your cervix opens so the baby can come out. The stitch keeps the cervix  from opening too soon. °· Staying at the hospital. °· Taking or getting medicines, such as: °? Hormone medicines. °? Medicines to stop contractions. °? Medicines to help the baby’s lungs develop. °? Medicines to prevent your baby from having cerebral palsy. °What should I do if I am in preterm labor? °If you think you are going into labor too soon, call your doctor right away. °How can I prevent preterm labor? °· Do not use any tobacco products. °? Examples of these are cigarettes, chewing tobacco, and e-cigarettes. °? If you need help quitting, ask your doctor. °· Do not use street drugs. °· Do not use any medicines unless you ask your doctor if they are safe for you. °· Talk with your doctor before taking any herbal supplements. °· Make sure you gain enough weight. °· Watch for infection. If you think you might have an infection, get it checked right away. °· If you have gone into preterm labor before, tell your doctor. °This information is not intended to replace advice given to you by your health care provider. Make sure you discuss any questions you have with your health care provider. °Document Released: 06/17/2008 Document Revised: 07/13/2018 Document Reviewed: 08/12/2015 °Elsevier Patient Education © 2020 Elsevier Inc. ° °

## 2018-11-28 NOTE — Telephone Encounter (Signed)
Attempted to return patient's call. Unable to leave a message on either line.

## 2018-11-28 NOTE — Telephone Encounter (Signed)
Pt is wanting to know if she should still take the progesterone as she is now dilated.

## 2018-11-28 NOTE — Discharge Summary (Signed)
Patient ID: Betty Harrison MRN: [id number] DOB/AGE: 04-10-2000 18 y.o.  Admit date: 11/27/2018 Discharge date: 11/28/2018  Admission Diagnoses: preterm labor  Discharge Diagnoses: preterm contractions  Prenatal Procedures: betamethasone, NST, magnesium sulfate  Consults: no  Significant Diagnostic Studies:  Results for orders placed or performed during the hospital encounter of 11/27/18 (from the past 168 hour(s))  CBC   Collection Time: 11/27/18 11:18 PM  Result Value Ref Range   WBC 9.2 4.0 - 10.5 K/uL   RBC 3.64 (L) 3.87 - 5.11 MIL/uL   Hemoglobin 10.4 (L) 12.0 - 15.0 g/dL   HCT 21.330.7 (L) 08.636.0 - 57.846.0 %   MCV 84.3 80.0 - 100.0 fL   MCH 28.6 26.0 - 34.0 pg   MCHC 33.9 30.0 - 36.0 g/dL   RDW 46.911.6 62.911.5 - 52.815.5 %   Platelets 224 150 - 400 K/uL   nRBC 0.0 0.0 - 0.2 %  RPR   Collection Time: 11/27/18 11:18 PM  Result Value Ref Range   RPR Ser Ql NON REACTIVE NON REACTIVE   RPR Ser-Titr PENDING   Type and screen [facility]   Collection Time: 11/27/18 11:18 PM  Result Value Ref Range   ABO/RH(D) A POS    Antibody Screen NEG    Sample Expiration      11/30/2018,2359 Performed at [facility], [address] [place], [place] [address]   Results for orders placed or performed in visit on 11/27/18 (from the past 168 hour(s))  Fetal fibronectin   Collection Time: 11/27/18  1:13 PM  Result Value Ref Range   Fetal Fibronectin Negative Negative  Results for orders placed or performed during the hospital encounter of 11/27/18 (from the past 168 hour(s))  Wet prep, genital   Collection Time: 11/27/18  3:39 PM   Specimen: Vaginal  Result Value Ref Range   Yeast Wet Prep HPF POC NONE SEEN NONE SEEN   Trich, Wet Prep NONE SEEN NONE SEEN   Clue Cells Wet Prep HPF POC PRESENT (A) NONE SEEN   WBC, Wet Prep HPF POC MANY (A) NONE SEEN   Sperm NONE SEEN   Urinalysis, Routine w reflex microscopic   Collection Time: 11/27/18  4:24 PM  Result Value Ref Range    Color, Urine YELLOW YELLOW   APPearance CLEAR CLEAR   Specific Gravity, Urine 1.009 1.005 - 1.030   pH 9.0 (H) 5.0 - 8.0   Glucose, UA NEGATIVE NEGATIVE mg/dL   Hgb urine dipstick MODERATE (A) NEGATIVE   Bilirubin Urine NEGATIVE NEGATIVE   Ketones, ur NEGATIVE NEGATIVE mg/dL   Protein, ur 30 (A) NEGATIVE mg/dL   Nitrite NEGATIVE NEGATIVE   Leukocytes,Ua TRACE (A) NEGATIVE   RBC / HPF 0-5 0 - 5 RBC/hpf   WBC, UA 11-20 0 - 5 WBC/hpf   Bacteria, UA RARE (A) NONE SEEN   Squamous Epithelial / LPF 0-5 0 - 5   Mucus PRESENT   Culture, beta strep (group b only)   Collection Time: 11/27/18  6:32 PM   Specimen: Vaginal/Rectal; Genital  Result Value Ref Range   Specimen Description VAGINAL/RECTAL    Special Requests NONE    Culture      CULTURE REINCUBATED FOR BETTER GROWTH Performed at [facility], [address] [place], [place] [address]    Report Status PENDING   SARS CORONAVIRUS 2 (TAT 6-12 HRS) Nasal Swab Aptima Multi Swab   Collection Time: 11/27/18  7:41 PM   Specimen: Aptima Multi Swab; Nasal  Swab  Result Value Ref Range   SARS Coronavirus 2 NEGATIVE NEGATIVE  Results for orders placed or performed during the hospital encounter of 11/26/18 (from the past 168 hour(s))  Urinalysis, Routine w reflex microscopic   Collection Time: 11/27/18 12:07 AM  Result Value Ref Range   Color, Urine YELLOW YELLOW   APPearance CLOUDY (A) CLEAR   Specific Gravity, Urine 1.015 1.005 - 1.030   pH 7.0 5.0 - 8.0   Glucose, UA NEGATIVE NEGATIVE mg/dL   Hgb urine dipstick NEGATIVE NEGATIVE   Bilirubin Urine NEGATIVE NEGATIVE   Ketones, ur 20 (A) NEGATIVE mg/dL   Protein, ur 30 (A) NEGATIVE mg/dL   Nitrite NEGATIVE NEGATIVE   Leukocytes,Ua NEGATIVE NEGATIVE   RBC / HPF 0-5 0 - 5 RBC/hpf   WBC, UA 21-50 0 - 5 WBC/hpf   Bacteria, UA RARE (A) NONE SEEN   Squamous Epithelial / LPF 0-5 0 - 5   Mucus PRESENT    Amorphous Crystal PRESENT     Treatments: betamethasone, flagyl, magnesium  sulfate  Hospital Course:  This is a 18 y.o. G1P0000 with IUP at [redacted]w[redacted]d admitted for preterm labor as a transfer from [place] due to NICU unavailable.  She was noted to have a cervical exam of 3cm per transfer, but on arrival cervix was Fingertip.  She remained finger tip (unchanged) for 24hrs.  No leaking of fluid and no bleeding.  She was initially started on magnesium sulfate for tocolysis and neuroprotection and also received betamethasone x 2 doses.  She had no signs/symptoms of progressing preterm labor or other maternal-fetal concerns.  Her cervical exam was unchanged from admission.  She was deemed stable for discharge to home with outpatient follow up.  Discharge Physical Exam:  BP 124/67 (BP Location: Right Arm)   Pulse 83   Temp 97.8 F (36.6 C) (Oral)   Resp 18   Ht 5\' 4"  (1.626 m)   Wt 64 kg   LMP 04/17/2018 (Exact Date)   SpO2 98%   BMI 24.20 kg/m   General: NAD CV: RRR Pulm: CTABL, nl effort ABD: s/nd/nt, gravid DVT Evaluation: LE non-ttp, no evidence of DVT on exam.  SVE: FT / 75 / ballotable, medium, mid position FHT: 130 mod + accels no decels TOCO: occasional   Discharge Condition: Stable  Disposition: Discharge disposition: 01-Home or Self Care        Allergies as of 11/28/2018      Reactions   Ivp Dye [iodinated Diagnostic Agents] Anaphylaxis   Bee Venom    Skin reaction   Eggs Or Egg-derived Products Nausea Only   Pollen Extract Other (See Comments)   unknown      Medication List    TAKE these medications   acetaminophen 500 MG tablet Commonly known as: TYLENOL Take 500 mg by mouth as needed.   Blood Pressure Monitor Misc For regular home bp monitoring during pregnancy   metroNIDAZOLE 500 MG tablet Commonly known as: FLAGYL Take 1 tablet (500 mg total) by mouth every 12 (twelve) hours for 6 days.   multivitamin-prenatal 27-0.8 MG Tabs tablet Take 1 tablet by mouth daily at 12 noon.   progesterone 200 MG capsule Commonly known  as: Prometrium Place 1 capsule (200 mg total) vaginally at bedtime.        Signed: ----- Larey Days, MD Attending Obstetrician and Gynecologist [facility]

## 2018-11-29 ENCOUNTER — Telehealth: Payer: Self-pay | Admitting: *Deleted

## 2018-11-29 LAB — CULTURE, BETA STREP (GROUP B ONLY)

## 2018-11-29 NOTE — Telephone Encounter (Signed)
Unable to leave message on any number listed.  Patient does need to continue Prometrium.

## 2018-12-01 LAB — GC/CHLAMYDIA PROBE AMP
Chlamydia trachomatis, NAA: NEGATIVE
Neisseria Gonorrhoeae by PCR: NEGATIVE

## 2018-12-05 ENCOUNTER — Encounter (HOSPITAL_COMMUNITY): Payer: Self-pay

## 2018-12-05 ENCOUNTER — Other Ambulatory Visit: Payer: Self-pay

## 2018-12-05 ENCOUNTER — Inpatient Hospital Stay (HOSPITAL_COMMUNITY)
Admission: AD | Admit: 2018-12-05 | Discharge: 2018-12-05 | Disposition: A | Discharge status: Home / Self Care | Payer: Medicaid Other | Attending: Obstetrics & Gynecology | Admitting: Obstetrics & Gynecology

## 2018-12-05 DIAGNOSIS — O479 False labor, unspecified: Secondary | ICD-10-CM

## 2018-12-05 DIAGNOSIS — Z87891 Personal history of nicotine dependence: Secondary | ICD-10-CM | POA: Diagnosis not present

## 2018-12-05 DIAGNOSIS — Z3A33 33 weeks gestation of pregnancy: Secondary | ICD-10-CM | POA: Diagnosis not present

## 2018-12-05 DIAGNOSIS — O4703 False labor before 37 completed weeks of gestation, third trimester: Secondary | ICD-10-CM | POA: Diagnosis not present

## 2018-12-05 LAB — WET PREP, GENITAL
Clue Cells Wet Prep HPF POC: NONE SEEN
Sperm: NONE SEEN
Trich, Wet Prep: NONE SEEN
Yeast Wet Prep HPF POC: NONE SEEN

## 2018-12-05 LAB — FETAL FIBRONECTIN: Fetal Fibronectin: NEGATIVE

## 2018-12-05 MED ORDER — LACTATED RINGERS IV BOLUS
1000.0000 mL | Freq: Once | INTRAVENOUS | Status: AC
  Administered 2018-12-05: 19:00:00 1000 mL via INTRAVENOUS

## 2018-12-05 NOTE — MAU Note (Signed)
Pt states that she started having ctx's around 1600.   Denies vaginal bleeding or LOF.   Reports losing mucous plug last Tuesday/wednesday.   Reports +FM

## 2018-12-05 NOTE — Discharge Instructions (Signed)
Fetal Movement Counts Patient Name: ________________________________________________ Patient Due Date: ____________________ What is a fetal movement count?  A fetal movement count is the number of times that you feel your baby move during a certain amount of time. This may also be called a fetal kick count. A fetal movement count is recommended for every pregnant woman. You may be asked to start counting fetal movements as early as week 28 of your pregnancy. Pay attention to when your baby is most active. You may notice your baby's sleep and wake cycles. You may also notice things that make your baby move more. You should do a fetal movement count:  When your baby is normally most active.  At the same time each day. A good time to count movements is while you are resting, after having something to eat and drink. How do I count fetal movements? 1. Find a quiet, comfortable area. Sit, or lie down on your side. 2. Write down the date, the start time and stop time, and the number of movements that you felt between those two times. Take this information with you to your health care visits. 3. For 2 hours, count kicks, flutters, swishes, rolls, and jabs. You should feel at least 10 movements during 2 hours. 4. You may stop counting after you have felt 10 movements. 5. If you do not feel 10 movements in 2 hours, have something to eat and drink. Then, keep resting and counting for 1 hour. If you feel at least 4 movements during that hour, you may stop counting. Contact a health care provider if:  You feel fewer than 4 movements in 2 hours.  Your baby is not moving like he or she usually does. Date: ____________ Start time: ____________ Stop time: ____________ Movements: ____________ Date: ____________ Start time: ____________ Stop time: ____________ Movements: ____________ Date: ____________ Start time: ____________ Stop time: ____________ Movements: ____________ Date: ____________ Start time:  ____________ Stop time: ____________ Movements: ____________ Date: ____________ Start time: ____________ Stop time: ____________ Movements: ____________ Date: ____________ Start time: ____________ Stop time: ____________ Movements: ____________ Date: ____________ Start time: ____________ Stop time: ____________ Movements: ____________ Date: ____________ Start time: ____________ Stop time: ____________ Movements: ____________ Date: ____________ Start time: ____________ Stop time: ____________ Movements: ____________ This information is not intended to replace advice given to you by your health care provider. Make sure you discuss any questions you have with your health care provider. Document Released: 04/20/2006 Document Revised: 04/10/2018 Document Reviewed: 04/30/2015 Elsevier Patient Education  2020 Elsevier Inc. Braxton Hicks Contractions Contractions of the uterus can occur throughout pregnancy, but they are not always a sign that you are in labor. You may have practice contractions called Braxton Hicks contractions. These false labor contractions are sometimes confused with true labor. What are Braxton Hicks contractions? Braxton Hicks contractions are tightening movements that occur in the muscles of the uterus before labor. Unlike true labor contractions, these contractions do not result in opening (dilation) and thinning of the cervix. Toward the end of pregnancy (32-34 weeks), Braxton Hicks contractions can happen more often and may become stronger. These contractions are sometimes difficult to tell apart from true labor because they can be very uncomfortable. You should not feel embarrassed if you go to the hospital with false labor. Sometimes, the only way to tell if you are in true labor is for your health care provider to look for changes in the cervix. The health care provider will do a physical exam and may monitor your contractions. If you   are not in true labor, the exam should show  that your cervix is not dilating and your water has not broken. If there are no other health problems associated with your pregnancy, it is completely safe for you to be sent home with false labor. You may continue to have Braxton Hicks contractions until you go into true labor. How to tell the difference between true labor and false labor True labor  Contractions last 30-70 seconds.  Contractions become very regular.  Discomfort is usually felt in the top of the uterus, and it spreads to the lower abdomen and low back.  Contractions do not go away with walking.  Contractions usually become more intense and increase in frequency.  The cervix dilates and gets thinner. False labor  Contractions are usually shorter and not as strong as true labor contractions.  Contractions are usually irregular.  Contractions are often felt in the front of the lower abdomen and in the groin.  Contractions may go away when you walk around or change positions while lying down.  Contractions get weaker and are shorter-lasting as time goes on.  The cervix usually does not dilate or become thin. Follow these instructions at home:   Take over-the-counter and prescription medicines only as told by your health care provider.  Keep up with your usual exercises and follow other instructions from your health care provider.  Eat and drink lightly if you think you are going into labor.  If Braxton Hicks contractions are making you uncomfortable: ? Change your position from lying down or resting to walking, or change from walking to resting. ? Sit and rest in a tub of warm water. ? Drink enough fluid to keep your urine pale yellow. Dehydration may cause these contractions. ? Do slow and deep breathing several times an hour.  Keep all follow-up prenatal visits as told by your health care provider. This is important. Contact a health care provider if:  You have a fever.  You have continuous pain in  your abdomen. Get help right away if:  Your contractions become stronger, more regular, and closer together.  You have fluid leaking or gushing from your vagina.  You pass blood-tinged mucus (bloody show).  You have bleeding from your vagina.  You have low back pain that you never had before.  You feel your baby's head pushing down and causing pelvic pressure.  Your baby is not moving inside you as much as it used to. Summary  Contractions that occur before labor are called Braxton Hicks contractions, false labor, or practice contractions.  Braxton Hicks contractions are usually shorter, weaker, farther apart, and less regular than true labor contractions. True labor contractions usually become progressively stronger and regular, and they become more frequent.  Manage discomfort from Braxton Hicks contractions by changing position, resting in a warm bath, drinking plenty of water, or practicing deep breathing. This information is not intended to replace advice given to you by your health care provider. Make sure you discuss any questions you have with your health care provider. Document Released: 08/04/2016 Document Revised: 03/03/2017 Document Reviewed: 08/04/2016 Elsevier Patient Education  2020 Else[employer] Inc.  

## 2018-12-05 NOTE — MAU Provider Note (Addendum)
History     CSN: [id number]  Arrival date and time: 12/05/18 1719   First Provider Initiated Contact with Patient 12/05/18 1755      Chief Complaint  Patient presents with  . Contractions   HPI  Ms.  Esmond Harpslexis Kohan is a 18 y.o. year old 111P0000 female at 3962w1d weeks gestation who presents to MAU via EMS reporting UCs since 1600. She reports she lost her mucous plug 8/25 or 8/26. She had a [facility]. She was seen later on 11/27/2018 in MAU for PTL, but was [facility]. She was given 2 doses of BMZ on 8/25 & 8/26, respectively.  She denies any VB or LOF.   Past Medical History:  Diagnosis Date  . Allergy   . Anxiety   . Arthritis   . Asthma   . 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 Bipolar 1 disorder (HCC)   . Depression   . Eating disorder    Pt reported she used to binge eat  . Headache   . Vision abnormalities    Pt wears glasses    History reviewed. No pertinent surgical history.  Family History  Problem Relation Age of Onset  . Asthma Mother   . Osteoarthritis Mother   . Diabetes Father   . Hypertension Father   . Crohn's disease Father   . Cancer Maternal Grandmother   . Blindness Paternal Uncle   . Deafness Paternal Uncle     Social History   Tobacco Use  . Smoking status: Former Games developermoker  . Smokeless tobacco: Never Used  . Tobacco comment: stopped when she had upt+  Substance Use Topics  . Alcohol use: No    Alcohol/week: 0.0 standard drinks  . Drug use: No    Allergies:  Allergies  Allergen Reactions  . Ivp Dye [Iodinated Diagnostic Agents] Anaphylaxis  . Bee Venom     Skin reaction   . Eggs Or Egg-Derived Products Nausea Only  . Pollen Extract Other (See Comments)    unknown    Medications Prior to Admission  Medication Sig Dispense Refill Last Dose  . acetaminophen (TYLENOL) 500 MG tablet Take 500 mg by mouth as needed.   Past Week  at Unknown time  . Blood Pressure Monitor MISC For regular home bp monitoring during pregnancy 1 each 0 Past Week at Unknown time  . Prenatal Vit-Fe Fumarate-FA (MULTIVITAMIN-PRENATAL) 27-0.8 MG TABS tablet Take 1 tablet by mouth daily at 12 noon.   12/05/2018 at Unknown time  . progesterone (PROMETRIUM) 200 MG capsule Place 1 capsule (200 mg total) vaginally at bedtime. 30 capsule 5 Past Month at Unknown time    Review of Systems  Constitutional: Negative.   HENT: Negative.   Eyes: Negative.   Respiratory: Negative.   Cardiovascular: Negative.   Gastrointestinal: Negative.   Endocrine: Negative.   Genitourinary: Positive for pelvic pain (contractions). Vaginal discharge: lost mucous plug.  Skin: Negative.   Allergic/Immunologic: Negative.   Neurological: Negative.   Hematological: Negative.   Psychiatric/Behavioral: Negative.    Physical Exam   Blood pressure 139/73, pulse 78, temperature 98.6 F (37 C), resp. rate 16, last menstrual period 04/17/2018, SpO2 97 %.  Physical Exam  Nursing note and vitals reviewed. Constitutional: She appears well-developed and well-nourished.  HENT:  Head: Normocephalic and atraumatic.  Eyes: Pupils are equal, round, and reactive to light.  Neck:  Normal range of motion.  Cardiovascular: Normal rate.  Respiratory: Effort normal.  GI: Soft.  Genitourinary:    Genitourinary Comments: Uterus: non-tender, SE: cervix is smooth, pink, no lesions, moderate amt of thick, white vaginal d/c -- WP done, closed/long/firm, no CMT or friability, no adnexal tenderness    Musculoskeletal: Normal range of motion.  Skin: Skin is warm and dry.  Saline Lock in LT forearm started by EMS  Psychiatric: She has a normal mood and affect. Her behavior is normal. Judgment and thought content normal.   NST - FHR: 150 bpm / moderate variability / accels present / decels absent / TOCO: irregular UCs  MAU Course  Procedures  MDM CCUA fFN Wet Prep  Results for  orders placed or performed during the hospital encounter of 12/05/18 (from the past 24 hour(s))  Fetal fibronectin     Status: None   Collection Time: 12/05/18  6:13 PM  Result Value Ref Range   Fetal Fibronectin NEGATIVE NEGATIVE  Wet prep, genital     Status: Abnormal   Collection Time: 12/05/18  6:13 PM   Specimen: Cervical/Vaginal swab  Result Value Ref Range   Yeast Wet Prep HPF POC NONE SEEN NONE SEEN   Trich, Wet Prep NONE SEEN NONE SEEN   Clue Cells Wet Prep HPF POC NONE SEEN NONE SEEN   WBC, Wet Prep HPF POC MANY (A) NONE SEEN   Sperm NONE SEEN      Report given to and care assumed by Jorje Guild, FNP  Laury Deep, MSN, CNM 12/05/2018, 5:56 PM    FFN negative Cervical exam unchanged Pt & SO requesting to be discharged home. Pt has appt in the office tomorrow morning.   Assessment and Plan  A:  1. Braxton Hick's contraction   2. [redacted] weeks gestation of pregnancy    P: Discharge home PTL precautions Keep f/u tomorrow  Jorje Guild, NP

## 2018-12-06 ENCOUNTER — Encounter: Payer: Self-pay | Admitting: Women's Health

## 2018-12-06 ENCOUNTER — Ambulatory Visit (INDEPENDENT_AMBULATORY_CARE_PROVIDER_SITE_OTHER): Payer: Medicaid Other | Admitting: Women's Health

## 2018-12-06 ENCOUNTER — Encounter: Payer: Medicaid Other | Admitting: Women's Health

## 2018-12-06 VITALS — BP 138/74 | HR 106 | Wt 141.0 lb

## 2018-12-06 DIAGNOSIS — O479 False labor, unspecified: Secondary | ICD-10-CM

## 2018-12-06 DIAGNOSIS — Z1389 Encounter for screening for other disorder: Secondary | ICD-10-CM

## 2018-12-06 DIAGNOSIS — N883 Incompetence of cervix uteri: Secondary | ICD-10-CM

## 2018-12-06 DIAGNOSIS — O1213 Gestational proteinuria, third trimester: Secondary | ICD-10-CM

## 2018-12-06 DIAGNOSIS — O0993 Supervision of high risk pregnancy, unspecified, third trimester: Secondary | ICD-10-CM

## 2018-12-06 DIAGNOSIS — Z3A33 33 weeks gestation of pregnancy: Secondary | ICD-10-CM

## 2018-12-06 DIAGNOSIS — Z331 Pregnant state, incidental: Secondary | ICD-10-CM

## 2018-12-06 DIAGNOSIS — O4700 False labor before 37 completed weeks of gestation, unspecified trimester: Secondary | ICD-10-CM

## 2018-12-06 LAB — POCT URINALYSIS DIPSTICK OB
Glucose, UA: NEGATIVE
Ketones, UA: NEGATIVE
Leukocytes, UA: NEGATIVE
Nitrite, UA: NEGATIVE

## 2018-12-06 MED ORDER — HYDROCODONE-ACETAMINOPHEN 5-325 MG PO TABS: 1.0000 | ORAL_TABLET | Freq: Four times a day (QID) | ORAL | 0 refills | Status: DC | PRN

## 2018-12-06 MED ORDER — NIFEDIPINE ER OSMOTIC RELEASE 30 MG PO TB24: 30.0000 mg | ORAL_TABLET | Freq: Two times a day (BID) | ORAL | 1 refills | Status: DC

## 2018-12-06 NOTE — Progress Notes (Signed)
HIGH-RISK PREGNANCY VISIT Patient name: Betty Harrison MRN [id number]  Date of birth: May 09, 2000 Chief Complaint:   High Risk Gestation (+ contractions; went to [facility] pm with contractions)  History of Present Illness:   Betty Harrison is a 18 y.o. G95P0000 female at [redacted]w[redacted]d with an Estimated Date of Delivery: 01/22/19 being seen today for ongoing management of a high-risk pregnancy complicated by short cervix, PTL Today she reports went to MAU last night w/ uc's, no cervical change.  Had 1 night stay @ [facility] d/t PTL w/ our NICU being full, received BMZ 8/25 & 8/26. Has had multiple neg fFN, latest last night. Contractions regular again today, this morning started having constant sharp pain Rt lower abd radiating down Rt leg, unable to walk w/o assistance. Has h/o 'slipped disc', had same pain before w/ this.  Contractions: Regular. Vag. Bleeding: None.  Movement: Present. denies leaking of fluid.  Review of Systems:   Pertinent items are noted in HPI Denies abnormal vaginal discharge w/ itching/odor/irritation, headaches, visual changes, shortness of breath, chest pain, abdominal pain, severe nausea/vomiting, or problems with urination or bowel movements unless otherwise stated above. Pertinent History Reviewed:  Reviewed past medical,surgical, social, obstetrical and family history.  Reviewed problem list, medications and allergies. Physical Assessment:   Vitals:   12/06/18 1127  BP: 138/74  Pulse: (!) 106  Weight: 141 lb (64 kg)  Body mass index is 24.2 kg/m.           Physical Examination:   General appearance: appears uncomfortable  Mental status: alert, oriented to person, place, and time  Skin: warm & dry   Extremities: Edema: Trace, co-exam w/ JVF, feels pain is from spine   Cardiovascular: normal heart rate noted  Respiratory: normal respiratory effort, no distress  Abdomen: gravid, soft, non-tender  Pelvic: Cervical exam performed, spec exam: cx visually closed, SVE: outer  os FT, inner os closed/70/-2         Fetal Status: Fetal Heart Rate (bpm): 155 Fundal Height: 31 cm Movement: Present    Fetal Surveillance Testing today: NST: FHR baseline 155 bpm, Variability: moderate, Accelerations:present, Decelerations:  Absent= Cat 1/Reactive Toco: regular, every 5 minutes     Results for orders placed or performed in visit on 12/06/18 (from the past 24 hour(s))  POC Urinalysis Dipstick OB   Collection Time: 12/06/18 11:29 AM  Result Value Ref Range   Color, UA     Clarity, UA     Glucose, UA Negative Negative   Bilirubin, UA     Ketones, UA neg    Spec Grav, UA     Blood, UA trace    pH, UA     POC,PROTEIN,UA Moderate (2+) Negative, Trace, Small (1+), Moderate (2+), Large (3+), 4+   Urobilinogen, UA     Nitrite, UA neg    Leukocytes, UA Negative Negative   Appearance     Odor    Results for orders placed or performed during the hospital encounter of 12/05/18 (from the past 24 hour(s))  Wet prep, genital   Collection Time: 12/05/18  6:13 PM   Specimen: Cervical/Vaginal swab  Result Value Ref Range   Yeast Wet Prep HPF POC NONE SEEN NONE SEEN   Trich, Wet Prep NONE SEEN NONE SEEN   Clue Cells Wet Prep HPF POC NONE SEEN NONE SEEN   WBC, Wet Prep HPF POC MANY (A) NONE SEEN   Sperm NONE SEEN   Fetal fibronectin   Collection Time: 12/05/18  6:13  PM  Result Value Ref Range   Fetal Fibronectin NEGATIVE NEGATIVE    Assessment & Plan:  1) High-risk pregnancy G1P0000 at 1349w2d with an Estimated Date of Delivery: 01/22/19   2) Short cx w/ PTL, contractions w/o cervical change, rx prometrium, s/p BMZ  3) Rt leg pain, difficulty walking> co-exam w/ JVF, likely from spine, rx hydrocodone #10  Meds:  Meds ordered this encounter  Medications  . NIFEdipine (PROCARDIA XL) 30 MG 24 hr tablet    Sig: Take 1 tablet (30 mg total) by mouth 2 (two) times daily.    Dispense:  60 tablet    Refill:  1    Order Specific Question:   Supervising Provider    Answer:    Despina HiddenEURE, LUTHER H [[id number]]  . DISCONTD: HYDROcodone-acetaminophen (NORCO/VICODIN) 5-325 MG tablet    Sig: Take 1 tablet by mouth every 6 (six) hours as needed for severe pain.    Dispense:  10 tablet    Refill:  0    Order Specific Question:   Supervising Provider    Answer:   Despina HiddenEURE, LUTHER H [[id number]]  . HYDROcodone-acetaminophen (NORCO/VICODIN) 5-325 MG tablet    Sig: Take 1 tablet by mouth every 6 (six) hours as needed for severe pain.    Dispense:  10 tablet    Refill:  0    Order Specific Question:   Supervising Provider    Answer:   Lazaro ArmsEURE, LUTHER H [[id number]]    Labs/procedures today: nst, spec exam, SVE  Treatment Plan:  Weekly visits  Reviewed: Preterm labor symptoms and general obstetric precautions including but not limited to vaginal bleeding, contractions, leaking of fluid and fetal movement were reviewed in detail with the patient.  All questions were answered.   Follow-up: Return in about 1 week (around 12/13/2018) for HROB w/ MD.  Orders Placed This Encounter  Procedures  . Urine Culture  . Protein / creatinine ratio, urine  . POC Urinalysis Dipstick OB   Cheral MarkerKimberly R Lemon Sternberg CNM, [place] DayWHNP-BC 12/06/2018 1:47 PM

## 2018-12-08 LAB — PROTEIN / CREATININE RATIO, URINE
Creatinine, Urine: 76.4 mg/dL
Protein, Ur: 60.4 mg/dL
Protein/Creat Ratio: 791 mg/g creat — ABNORMAL HIGH (ref 0–200)

## 2018-12-09 LAB — URINE CULTURE: Organism ID, Bacteria: NO GROWTH

## 2018-12-14 ENCOUNTER — Encounter: Payer: Self-pay | Admitting: Obstetrics & Gynecology

## 2018-12-14 ENCOUNTER — Other Ambulatory Visit: Payer: Self-pay

## 2018-12-14 ENCOUNTER — Ambulatory Visit (INDEPENDENT_AMBULATORY_CARE_PROVIDER_SITE_OTHER): Payer: Medicaid Other | Admitting: Obstetrics & Gynecology

## 2018-12-14 VITALS — BP 136/86 | HR 92 | Wt 140.0 lb

## 2018-12-14 DIAGNOSIS — Z331 Pregnant state, incidental: Secondary | ICD-10-CM

## 2018-12-14 DIAGNOSIS — O0993 Supervision of high risk pregnancy, unspecified, third trimester: Secondary | ICD-10-CM

## 2018-12-14 DIAGNOSIS — Z3A34 34 weeks gestation of pregnancy: Secondary | ICD-10-CM

## 2018-12-14 DIAGNOSIS — Z1389 Encounter for screening for other disorder: Secondary | ICD-10-CM

## 2018-12-14 LAB — POCT URINALYSIS DIPSTICK OB
Blood, UA: NEGATIVE
Glucose, UA: NEGATIVE
Ketones, UA: NEGATIVE
Leukocytes, UA: NEGATIVE
Nitrite, UA: NEGATIVE
POC,PROTEIN,UA: NEGATIVE

## 2018-12-14 NOTE — Progress Notes (Signed)
   HIGH-RISK PREGNANCY VISIT Patient name: Betty Harrison MRN [id number]  Date of birth: 01/01/2001 Chief Complaint:   Routine Prenatal Visit  History of Present Illness:   Betty Harrison is a 18 y.o. G44P0000 female at [redacted]w[redacted]d with an Estimated Date of Delivery: 01/22/19 being seen today for ongoing management of a high-risk pregnancy complicated by PTL and borderline pre eclampsia Today she reports backache. Contractions: Irregular. Vag. Bleeding: None.  Movement: Present. denies leaking of fluid.  Review of Systems:   Pertinent items are noted in HPI Denies abnormal vaginal discharge w/ itching/odor/irritation, headaches, visual changes, shortness of breath, chest pain, abdominal pain, severe nausea/vomiting, or problems with urination or bowel movements unless otherwise stated above. Pertinent History Reviewed:  Reviewed past medical,surgical, social, obstetrical and family history.  Reviewed problem list, medications and allergies. Physical Assessment:   Vitals:   12/14/18 1029  BP: 136/86  Pulse: 92  Weight: 140 lb (63.5 kg)  Body mass index is 24.03 kg/m.           Physical Examination:   General appearance: alert, well appearing, and in no distress  Mental status: alert, oriented to person, place, and time  Skin: warm & dry   Extremities: Edema: Trace    Cardiovascular: normal heart rate noted  Respiratory: normal respiratory effort, no distress  Abdomen: gravid, soft, non-tender  Pelvic: Cervical exam deferred         Fetal Status: Fetal Heart Rate (bpm): 152 Fundal Height: 32 cm Movement: Present    Fetal Surveillance Testing today: FHR 152   Results for orders placed or performed in visit on 12/14/18 (from the past 24 hour(s))  POC Urinalysis Dipstick OB   Collection Time: 12/14/18 10:33 AM  Result Value Ref Range   Color, UA     Clarity, UA     Glucose, UA Negative Negative   Bilirubin, UA     Ketones, UA neg    Spec Grav, UA     Blood, UA neg    pH, UA     POC,PROTEIN,UA Negative Negative, Trace, Small (1+), Moderate (2+), Large (3+), 4+   Urobilinogen, UA     Nitrite, UA neg    Leukocytes, UA Negative Negative   Appearance     Odor      Assessment & Plan:  1) High-risk pregnancy G1P0000 at [redacted]w[redacted]d with an Estimated Date of Delivery: 01/22/19   2) PTL with short cervix on procardia, s/p BMZ, stable  3) Borderline BP in conjunction with proteinuria last week, none at this visit,, stable, suspect will become pre eclamptic in the coming weeks  Meds: No orders of the defined types were placed in this encounter.   Labs/procedures today:   Treatment Plan:  Weekly assessment for now increase with fetal surveillance if is frankly pre eclamptic  Reviewed: Preterm labor symptoms and general obstetric precautions including but not limited to vaginal bleeding, contractions, leaking of fluid and fetal movement were reviewed in detail with the patient.  All questions were answered. Has home bp cuff. Rx faxed to . Check bp weekly, let us know if >140/90.   Follow-up: Return in about 1 week (around 12/21/2018) for [place].  Orders Placed This Encounter  Procedures  . POC Urinalysis Dipstick OB   Florian Buff  12/14/2018 10:53 AM

## 2018-12-17 ENCOUNTER — Encounter: Payer: Self-pay | Admitting: *Deleted

## 2018-12-17 ENCOUNTER — Encounter (HOSPITAL_COMMUNITY): Payer: Self-pay | Admitting: *Deleted

## 2018-12-17 ENCOUNTER — Other Ambulatory Visit: Payer: Self-pay

## 2018-12-17 ENCOUNTER — Inpatient Hospital Stay (HOSPITAL_COMMUNITY)
Admission: AD | Admit: 2018-12-17 | Discharge: 2018-12-17 | Disposition: A | Discharge status: Home / Self Care | Payer: Medicaid Other | Attending: Obstetrics and Gynecology | Admitting: Obstetrics and Gynecology

## 2018-12-17 DIAGNOSIS — O4703 False labor before 37 completed weeks of gestation, third trimester: Secondary | ICD-10-CM

## 2018-12-17 DIAGNOSIS — R1030 Lower abdominal pain, unspecified: Secondary | ICD-10-CM | POA: Diagnosis present

## 2018-12-17 DIAGNOSIS — Z87891 Personal history of nicotine dependence: Secondary | ICD-10-CM | POA: Insufficient documentation

## 2018-12-17 DIAGNOSIS — O368330 Maternal care for abnormalities of the fetal heart rate or rhythm, third trimester, not applicable or unspecified: Secondary | ICD-10-CM | POA: Insufficient documentation

## 2018-12-17 DIAGNOSIS — Z3A34 34 weeks gestation of pregnancy: Secondary | ICD-10-CM | POA: Insufficient documentation

## 2018-12-17 LAB — URINALYSIS, ROUTINE W REFLEX MICROSCOPIC
Bilirubin Urine: NEGATIVE
Glucose, UA: NEGATIVE mg/dL
Ketones, ur: 20 mg/dL — AB
Nitrite: NEGATIVE
Protein, ur: 100 mg/dL — AB
Specific Gravity, Urine: 1.009 (ref 1.005–1.030)
pH: 7 (ref 5.0–8.0)

## 2018-12-17 MED ORDER — LACTATED RINGERS IV BOLUS
1000.0000 mL | Freq: Once | INTRAVENOUS | Status: AC
  Administered 2018-12-17: 16:00:00 1000 mL via INTRAVENOUS

## 2018-12-17 MED ORDER — LACTATED RINGERS IV BOLUS
1000.0000 mL | Freq: Once | INTRAVENOUS | Status: AC
  Administered 2018-12-17: 1000 mL via INTRAVENOUS

## 2018-12-17 MED ORDER — TERBUTALINE SULFATE 1 MG/ML IJ SOLN
0.2500 mg | Freq: Once | INTRAMUSCULAR | Status: AC
  Administered 2018-12-17: 17:00:00 0.25 mg via SUBCUTANEOUS
  Filled 2018-12-17: qty 1

## 2018-12-17 MED ORDER — TERBUTALINE SULFATE 1 MG/ML IJ SOLN
0.2500 mg | Freq: Once | INTRAMUSCULAR | Status: AC
  Administered 2018-12-17: 0.25 mg via SUBCUTANEOUS
  Filled 2018-12-17: qty 1

## 2018-12-17 MED ORDER — NIFEDIPINE 10 MG PO CAPS
10.0000 mg | ORAL_CAPSULE | ORAL | Status: AC
  Administered 2018-12-17: 10 mg via ORAL
  Filled 2018-12-17 (×2): qty 1

## 2018-12-17 NOTE — Progress Notes (Signed)
FHR baseline change noted

## 2018-12-17 NOTE — MAU Note (Addendum)
Having a lot of pain in abd, lower back pain and pelvic pressure.  Started about 1130/1200.  Pain is constant and random.  Is taking procardia twice a day, did take it this morning.

## 2018-12-17 NOTE — MAU Provider Note (Signed)
History     CSN: [id number]  Arrival date and time: 12/17/18 1435   First Provider Initiated Contact with Patient 12/17/18 1546      Chief Complaint  Patient presents with  . Abdominal Pain  . Back Pain  . Pelvic Pain   HPI  Ms.  Betty Harrison is a [age] y.o. year old 41P0000 female at 7673w6d weeks gestation who presents to MAU reporting lots of lower abdominal, lower back pain and pelvic pressure since about 1130 or 1200 today. She describes the pain as constant. She reports waking up today at 1030. She ate a "snack", but vomited it all up because of the pain. She take Procardia twice a day for PTL; last dose was at 1030 this AM. She takes Prometrium for shortened cervix. She has received BMZ on 8/25 & 8/26. She denies any recent SI, VB or LOF.  Past Medical History:  Diagnosis Date  . Allergy   . Anxiety   . Arthritis   . Asthma   . 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 Bipolar 1 disorder (HCC)   . Depression   . Eating disorder    Pt reported she used to binge eat  . Headache   . Vision abnormalities    Pt wears glasses    History reviewed. No pertinent surgical history.  Family History  Problem Relation Age of Onset  . Asthma Mother   . Osteoarthritis Mother   . Diabetes Father   . Hypertension Father   . Crohn's disease Father   . Cancer Maternal Grandmother   . Blindness Paternal Uncle   . Deafness Paternal Uncle     Social History   Tobacco Use  . Smoking status: Former Games developermoker  . Smokeless tobacco: Never Used  . Tobacco comment: stopped when she had upt+  Substance Use Topics  . Alcohol use: No    Alcohol/week: 0.0 standard drinks  . Drug use: No    Allergies:  Allergies  Allergen Reactions  . Ivp Dye [Iodinated Diagnostic Agents] Anaphylaxis  . Bee Venom     Skin reaction   . Eggs Or Egg-Derived Products Nausea Only  . Pollen Extract Other (See Comments)    unknown    Medications Prior to Admission   Medication Sig Dispense Refill Last Dose  . acetaminophen (TYLENOL) 500 MG tablet Take 500 mg by mouth as needed.   12/16/2018 at Unknown time  . NIFEdipine (PROCARDIA XL) 30 MG 24 hr tablet Take 1 tablet (30 mg total) by mouth 2 (two) times daily. 60 tablet 1 12/17/2018 at 1030  . Prenatal Vit-Fe Fumarate-FA (MULTIVITAMIN-PRENATAL) 27-0.8 MG TABS tablet Take 1 tablet by mouth daily at 12 noon.   12/16/2018 at 1230  . progesterone (PROMETRIUM) 200 MG capsule Place 1 capsule (200 mg total) vaginally at bedtime. 30 capsule 5 12/16/2018 at 2000  . Blood Pressure Monitor MISC For regular home bp monitoring during pregnancy 1 each 0   . HYDROcodone-acetaminophen (NORCO/VICODIN) 5-325 MG tablet Take 1 tablet by mouth every 6 (six) hours as needed for severe pain. 10 tablet 0 12/15/2018    Review of Systems  Constitutional: Negative.   HENT: Negative.   Eyes: Negative.   Respiratory: Negative.   Cardiovascular: Negative.   Gastrointestinal: Positive for vomiting (once after eating s small snack).  Endocrine: Negative.   Genitourinary: Positive for pelvic pain ("lots of pressure", frequent UC's).  Musculoskeletal: Positive for back pain.  Skin:  Negative.   Allergic/Immunologic: Negative.   Neurological: Negative.   Hematological: Negative.   Psychiatric/Behavioral: Negative.    Physical Exam   Blood pressure 134/70, pulse 83, temperature 98.2 F (36.8 C), temperature source Oral, resp. rate 20, last menstrual period 04/17/2018, SpO2 100 %.  Physical Exam  Nursing note and vitals reviewed. Constitutional: She is oriented to person, place, and time. She appears well-developed and well-nourished.  HENT:  Head: Normocephalic and atraumatic.  Eyes: Pupils are equal, round, and reactive to light.  Neck: Normal range of motion.  Cardiovascular: Normal rate.  Respiratory: Effort normal.  GI: Soft. There is no CVA tenderness.  Genitourinary:    Genitourinary Comments: Dilation:  Fingertip Effacement (%): 70 Station: -1 Presentation: Vertex Exam by: Sunday Corn, CNM  *known h/o shortened cx   Musculoskeletal: Normal range of motion.  Neurological: She is alert and oriented to person, place, and time. She has normal reflexes.  Skin: Skin is warm and dry.  Psychiatric: She has a normal mood and affect. Her behavior is normal. Judgment and thought content normal.   NST - FHR: 145 bpm / moderate variability / accels present / decels absent / TOCO: regular every 2-5 mins   Cervical Recheck @ 1717: unchanged  MAU Course  Procedures  MDM CEFM  LR bolus x 2 1000 ml @ 999 ml/hr for fetal tachycardia Procardia 10 mg po every 20 mins x 3 doses for preterm UC's  *Consult with Dr. Elly Modena @ 1650 - notified of patient's complaints, assessments, lab, NST results, & low diastolic pressure prior to administration of 2nd dose of Procardia, tx plan Terbutaline 0.25 mg SQ - agrees with plan  Updated Dr. Elonda Husky @ 1905 with reassessment and NST -- orders received to give another dose of Terbutaline and d/c home, advise to continue Procardia as Rx'd and keep appt on 12/21/18 -- ok to d/c home  Assessment and Plan  Preterm uterine contractions in third trimester, antepartum  - Advised that UC's noted on the monitor are not changing her cervical exam - Advised to continue taking Procardia as Rx'd - Discharge patient - Keep scheduled appt with [facility] on 12/21/18 - Patient verbalized an understanding of the plan of care and agrees.    Laury Deep, MSN, CNM 12/17/2018, 3:48 PM

## 2018-12-18 ENCOUNTER — Other Ambulatory Visit: Payer: Self-pay

## 2018-12-18 ENCOUNTER — Ambulatory Visit (INDEPENDENT_AMBULATORY_CARE_PROVIDER_SITE_OTHER): Payer: Medicaid Other | Admitting: Obstetrics and Gynecology

## 2018-12-18 ENCOUNTER — Inpatient Hospital Stay (HOSPITAL_COMMUNITY)
Admission: AD | Admit: 2018-12-18 | Discharge: 2018-12-18 | Disposition: A | Discharge status: Home / Self Care | Payer: Medicaid Other | Attending: Family Medicine | Admitting: Family Medicine

## 2018-12-18 ENCOUNTER — Encounter (HOSPITAL_COMMUNITY): Payer: Self-pay

## 2018-12-18 ENCOUNTER — Ambulatory Visit (INDEPENDENT_AMBULATORY_CARE_PROVIDER_SITE_OTHER): Payer: Medicaid Other | Admitting: Internal Medicine

## 2018-12-18 VITALS — BP 138/84

## 2018-12-18 DIAGNOSIS — Z87891 Personal history of nicotine dependence: Secondary | ICD-10-CM | POA: Diagnosis not present

## 2018-12-18 DIAGNOSIS — O26853 Spotting complicating pregnancy, third trimester: Secondary | ICD-10-CM | POA: Diagnosis not present

## 2018-12-18 DIAGNOSIS — O0990 Supervision of high risk pregnancy, unspecified, unspecified trimester: Secondary | ICD-10-CM

## 2018-12-18 DIAGNOSIS — Z3A35 35 weeks gestation of pregnancy: Secondary | ICD-10-CM | POA: Insufficient documentation

## 2018-12-18 DIAGNOSIS — O4703 False labor before 37 completed weeks of gestation, third trimester: Secondary | ICD-10-CM | POA: Diagnosis not present

## 2018-12-18 DIAGNOSIS — O0993 Supervision of high risk pregnancy, unspecified, third trimester: Secondary | ICD-10-CM

## 2018-12-18 DIAGNOSIS — Z0371 Encounter for suspected problem with amniotic cavity and membrane ruled out: Secondary | ICD-10-CM

## 2018-12-18 DIAGNOSIS — Z3689 Encounter for other specified antenatal screening: Secondary | ICD-10-CM

## 2018-12-18 DIAGNOSIS — O09293 Supervision of pregnancy with other poor reproductive or obstetric history, third trimester: Secondary | ICD-10-CM

## 2018-12-18 DIAGNOSIS — N883 Incompetence of cervix uteri: Secondary | ICD-10-CM

## 2018-12-18 LAB — URINALYSIS, ROUTINE W REFLEX MICROSCOPIC
Bilirubin Urine: NEGATIVE
Glucose, UA: NEGATIVE mg/dL
Hgb urine dipstick: NEGATIVE
Ketones, ur: NEGATIVE mg/dL
Leukocytes,Ua: NEGATIVE
Nitrite: NEGATIVE
Protein, ur: NEGATIVE mg/dL
Specific Gravity, Urine: 1.011 (ref 1.005–1.030)
pH: 8 (ref 5.0–8.0)

## 2018-12-18 NOTE — Progress Notes (Signed)
Patient ID: Betty HarpsAlexis Beevers, female   DOB: 2000-11-19, 18 y.o.   MRN: [id number]    [facility] Patient name: Betty Harrison MRN [id number]  Date of birth: 2000-11-19 Chief Complaint:   No chief complaint on file.  History of Present Illness:   Betty Harrison is a 18 y.o. G1P0000 female at 7942w0d with an Estimated Date of Delivery: 01/22/19 being seen today for ongoing management of a high-risk pregnancy complicated by Pre-term labor, and bordelrine pre-e Denies any blood or gush of fluid. Having severe pelvic and back pain, feels as if she has to have bowel movement but knows she doesn't have to defecate. Is currently taking procardia. Was not in this much pain when seen at MAU last night 134/98 in both arms checked  Today in office. Pt declines analgesic and will return to MAU in gush of fluid or bleeding. Today she reports backache and no bleeding.  .  .   . denies leaking of fluid.  Review of Systems:   Pertinent items are noted in HPI Denies abnormal vaginal discharge w/ itching/odor/irritation, headaches, visual changes, shortness of breath, chest pain, severe nausea/vomiting, or problems with urination or bowel movements unless otherwise stated above. Pertinent History Reviewed:  Reviewed past medical,surgical, social, obstetrical and family history.  Reviewed problem list, medications and allergies. Physical Assessment:  There were no vitals filed for this visit.There is no height or weight on file to calculate BMI.           Physical Examination:   General appearance: in mild to moderate distress and crying  Mental status: alert, oriented to person, place, and time, normal mood, behavior, speech, dress, motor activity, and thought processes  Skin: warm & dry   Extremities:      Cardiovascular: normal heart rate noted  Respiratory: normal respiratory effort, no distress  Abdomen: gravid, soft, non-tender  Pelvic: Cervical exam performed, normal secretion low,  -1 gestation 60%  thinned out, anterior        Fetal Status: Fetal Heart Rate (bpm): 161 Fundal Height: 33 cm      Fetal Surveillance Testing today: NST , reactive  Results for orders placed or performed during the hospital encounter of 12/17/18 (from the past 24 hour(s))  Urinalysis, Routine w reflex microscopic   Collection Time: 12/17/18  3:54 PM  Result Value Ref Range   Color, Urine YELLOW YELLOW   APPearance HAZY (A) CLEAR   Specific Gravity, Urine 1.009 1.005 - 1.030   pH 7.0 5.0 - 8.0   Glucose, UA NEGATIVE NEGATIVE mg/dL   Hgb urine dipstick SMALL (A) NEGATIVE   Bilirubin Urine NEGATIVE NEGATIVE   Ketones, ur 20 (A) NEGATIVE mg/dL   Protein, ur 782100 (A) NEGATIVE mg/dL   Nitrite NEGATIVE NEGATIVE   Leukocytes,Ua SMALL (A) NEGATIVE   RBC / HPF 0-5 0 - 5 RBC/hpf   WBC, UA 11-20 0 - 5 WBC/hpf   Bacteria, UA FEW (A) NONE SEEN   Squamous Epithelial / LPF 0-5 0 - 5   Mucus PRESENT     Assessment & Plan:  1) High-risk pregnancy G1P0000 at 5942w0d with an Estimated Date of Delivery: 01/22/19   2) PTL , stable, NST reassuring  3) borderline pre-e, still doesn't fully meet criteria  Meds: No orders of the defined types were placed in this encounter.   Labs/procedures today: NST  Treatment Plan:  1 week for GBS if hasn't delivered  Reviewed: Preterm labor symptoms and general obstetric precautions including but not limited  to vaginal bleeding, contractions, leaking of fluid and fetal movement were reviewed in detail with the patient.  All questions were answered.   Follow-up: No follow-ups on file.  No orders of the defined types were placed in this encounter.  By signing my name below, I, Samul Dada, attest that this documentation has been prepared under the direction and in the presence of Jonnie Kind, MD. Electronically Signed: [place]. 12/18/18. 10:37 AM.  I personally performed the services described in this documentation, which was SCRIBED in my presence.  The recorded information has been reviewed and considered accurate. It has been edited as necessary during review. Jonnie Kind, MD

## 2018-12-18 NOTE — MAU Note (Signed)
Pt reports she was here yesterday had contractions not dilating.  Still having ctx today went to office and still no dialation. Sent home and told to walk. Went home and had some fluid leak out went back to odffice. nthough it was the gel and sent home told to come back if she had more leaking or bleeding. Pt is spotting now and still having frequent  Contractions. Good fetal movement felt.

## 2018-12-18 NOTE — MAU Provider Note (Signed)
History     CSN: [id number]  Arrival date and time: 12/18/18 2033   First Provider Initiated Contact with Patient 12/18/18 2158      Chief Complaint  Patient presents with  . Contractions    Betty Harrison is a 18 y.o. G1P0 at [redacted]w[redacted]d who receives care at [facility].  She presents today for Contractions and Spotting.  She reports that she has had some "light pinkish" spotting around 720pm.  She states that she was seen in the office and received a pelvic exam and also endorses having a pelvic exam yesterday.  She reports that she had some spotting upon arrival with wiping and noted some in her underwear as well.  Patient reports "normal white vaginal discharge" and endorses fetal movement and contractions. She reports that she has continued to have contractions since yesterday despite terbutaline dosing x2.  Patient states she has taken her procardia this morning around 8am for contractions, but had no relief. Patient reports that she was 1cm this morning.      OB History    Gravida  1   Para  0   Term  0   Preterm  0   AB  0   Living  0     SAB  0   TAB  0   Ectopic  0   Multiple  0   Live Births  0           Past Medical History:  Diagnosis Date  . Allergy   . Anxiety   . Arthritis   . Asthma   . 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 Bipolar 1 disorder (HCC)   . Depression   . Eating disorder    Pt reported she used to binge eat  . Headache   . Vision abnormalities    Pt wears glasses    History reviewed. No pertinent surgical history.  Family History  Problem Relation Age of Onset  . Asthma Mother   . Osteoarthritis Mother   . Diabetes Father   . Hypertension Father   . Crohn's disease Father   . Cancer Maternal Grandmother   . Blindness Paternal Uncle   . Deafness Paternal Uncle     Social History   Tobacco Use  . Smoking status: Former Games developer  . Smokeless tobacco: Never Used  . Tobacco comment:  stopped when she had upt+  Substance Use Topics  . Alcohol use: No    Alcohol/week: 0.0 standard drinks  . Drug use: No    Allergies:  Allergies  Allergen Reactions  . Ivp Dye [Iodinated Diagnostic Agents] Anaphylaxis  . Bee Venom     Skin reaction   . Eggs Or Egg-Derived Products Nausea Only  . Pollen Extract Other (See Comments)    unknown    Medications Prior to Admission  Medication Sig Dispense Refill Last Dose  . acetaminophen (TYLENOL) 500 MG tablet Take 500 mg by mouth as needed.   Past Month at Unknown time  . Blood Pressure Monitor MISC For regular home bp monitoring during pregnancy 1 each 0 12/18/2018 at Unknown time  . HYDROcodone-acetaminophen (NORCO/VICODIN) 5-325 MG tablet Take 1 tablet by mouth every 6 (six) hours as needed for severe pain. 10 tablet 0 Past Week at Unknown time  . NIFEdipine (PROCARDIA XL) 30 MG 24 hr tablet Take 1 tablet (30 mg total) by mouth 2 (two) times daily. 60 tablet 1 12/18/2018 at Unknown time  .  Prenatal Vit-Fe Fumarate-FA (MULTIVITAMIN-PRENATAL) 27-0.8 MG TABS tablet Take 1 tablet by mouth daily at 12 noon.   12/17/2018 at Unknown time  . progesterone (PROMETRIUM) 200 MG capsule Place 1 capsule (200 mg total) vaginally at bedtime. 30 capsule 5 Past Week at Unknown time    Review of Systems  Constitutional: Negative for chills and fever.  Respiratory: Negative for cough and shortness of breath.   Gastrointestinal: Positive for abdominal pain (Contractions). Negative for constipation, diarrhea, nausea and vomiting.  Genitourinary: Positive for vaginal bleeding. Negative for difficulty urinating, dysuria and vaginal discharge.  Neurological: Negative for dizziness, light-headedness and headaches.   Physical Exam   Blood pressure 135/83, pulse 78, resp. rate 18, last menstrual period 04/17/2018.  Physical Exam  Constitutional: She is oriented to person, place, and time. She appears well-developed and well-nourished.  HENT:  Head:  Normocephalic and atraumatic.  Eyes: Conjunctivae are normal.  Neck: Normal range of motion.  Cardiovascular: Normal rate.  Respiratory: Effort normal.  GI: Soft. There is no abdominal tenderness.  Genitourinary: Cervix exhibits friability. Cervix exhibits no motion tenderness.    No vaginal discharge or bleeding.  No bleeding in the vagina.    Genitourinary Comments: Speculum Exam: -Normal External Genitalia: Non tender, no apparent discharge at introitus.  -Vaginal Vault: Pink mucosa with good rugae. No apparent discharge in vault -Cervix:Pink, no lesions, cysts, or polyps. Friable with active bleeding noted at 5 o'clock. Hemostatic with pressure. Appears closed. No active bleeding from os -Bimanual Exam:  Dilation: Fingertip Effacement (%): 70 Station: -2 Exam by:: Gerrit HeckJessica Miette Molenda CNM    Musculoskeletal: Normal range of motion.  Neurological: She is alert and oriented to person, place, and time.  Skin: Skin is warm and dry.  Psychiatric: She has a normal mood and affect. Her behavior is normal.    Fetal Assessment 150 bpm, Mod Var, -Decels, +Accels Toco: Q5410min, Irritability noted  MAU Course   Results for orders placed or performed during the hospital encounter of 12/18/18 (from the past 24 hour(s))  Urinalysis, Routine w reflex microscopic     Status: Abnormal   Collection Time: 12/18/18  9:38 PM  Result Value Ref Range   Color, Urine YELLOW YELLOW   APPearance CLOUDY (A) CLEAR   Specific Gravity, Urine 1.011 1.005 - 1.030   pH 8.0 5.0 - 8.0   Glucose, UA NEGATIVE NEGATIVE mg/dL   Hgb urine dipstick NEGATIVE NEGATIVE   Bilirubin Urine NEGATIVE NEGATIVE   Ketones, ur NEGATIVE NEGATIVE mg/dL   Protein, ur NEGATIVE NEGATIVE mg/dL   Nitrite NEGATIVE NEGATIVE   Leukocytes,Ua NEGATIVE NEGATIVE   No results found.  MDM PE Labs: UA EFM  Assessment and Plan  18 year old G1P0  SIUP at 35weeks Cat I FT Contractions Spotting  -Exam findings discussed. -Informed of  friable cervix which is cause for light pink spotting. -Questions why cervix is bleeding and reminded of frequent cervical exams in last 24 hours that have likely contributed to bleeding.  -Extensive discussion regarding expectation of light spotting and cramping after vaginal exams.  -Reviewed contractions that graph infrequently. -Will monitor and reassess in one hour.  Cherre RobinsJessica L Jasin Brazel MSN, CNM 12/18/2018, 9:59 PM   Reassessment (11:00 PM)  Patient reports that the contracitons have decreased. Reports she has an appt on Friday NST remains reactive Reiterated Bleeding Precautions Labor Precautions Given No questions or concerns. -Encouraged to call or return to MAU if symptoms worsen or with the onset of new symptoms. -Discharged to home in stable  condition.  Maryann Conners MSN, CNM

## 2018-12-18 NOTE — Discharge Instructions (Signed)

## 2018-12-21 ENCOUNTER — Ambulatory Visit (INDEPENDENT_AMBULATORY_CARE_PROVIDER_SITE_OTHER): Payer: Medicaid Other | Admitting: Obstetrics and Gynecology

## 2018-12-21 ENCOUNTER — Other Ambulatory Visit: Payer: Self-pay

## 2018-12-21 VITALS — BP 140/88 | HR 99 | Wt 142.8 lb

## 2018-12-21 DIAGNOSIS — Z3A35 35 weeks gestation of pregnancy: Secondary | ICD-10-CM

## 2018-12-21 DIAGNOSIS — O133 Gestational [pregnancy-induced] hypertension without significant proteinuria, third trimester: Secondary | ICD-10-CM

## 2018-12-21 DIAGNOSIS — O0993 Supervision of high risk pregnancy, unspecified, third trimester: Secondary | ICD-10-CM

## 2018-12-21 DIAGNOSIS — O0990 Supervision of high risk pregnancy, unspecified, unspecified trimester: Secondary | ICD-10-CM

## 2018-12-21 DIAGNOSIS — Z1389 Encounter for screening for other disorder: Secondary | ICD-10-CM

## 2018-12-21 LAB — POCT URINALYSIS DIPSTICK OB
Blood, UA: NEGATIVE
Glucose, UA: NEGATIVE
Ketones, UA: NEGATIVE
Leukocytes, UA: NEGATIVE
Nitrite, UA: NEGATIVE
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
## 2018-12-21 NOTE — Progress Notes (Signed)
Patient ID: Percell Locus, female   DOB: 2000-05-18, 18 y.o.   MRN: [id number]    LOW-RISK PREGNANCY VISIT Patient name: Betty Harrison MRN [id number]  Date of birth: 03-Jun-2000 Chief Complaint:   Routine Prenatal Visit  History of Present Illness:   Betty Harrison is a 18 y.o. G59P0000 female at 85w3dwith an Estimated Date of Delivery: 01/22/19 being seen today for ongoing management of a low-risk pregnancy. Is doing much better since last visit of early uterine contractions. Contractions eased up around yesterday afternoon. BP continuously rising. Hasn't taken procardia or prometrium.  Today she reports no complaints. Contractions: Irregular. Vag. Bleeding: None.  Movement: Present. denies leaking of fluid. Review of Systems:   Pertinent items are noted in HPI Denies abnormal vaginal discharge w/ itching/odor/irritation, headaches, visual changes, shortness of breath, chest pain, abdominal pain, severe nausea/vomiting, or problems with urination or bowel movements unless otherwise stated above. Pertinent History Reviewed:  Reviewed past medical,surgical, social, obstetrical and family history.  Reviewed problem list, medications and allergies. Physical Assessment:   Vitals:   12/21/18 1102 12/21/18 1110  BP: (!) 139/91 140/88  Pulse: 72 99  Weight: 142 lb 12.8 oz (64.8 kg)   Body mass index is 24.51 kg/m.        Physical Examination: recheck 140/88  General appearance: Well appearing, and in no distress  Mental status: Alert, oriented to person, place, and time  Skin: Warm & dry  Cardiovascular: Normal heart rate noted  Respiratory: Normal respiratory effort, no distress  Abdomen: Soft, gravid, nontender  Pelvic: Cervical exam performed short, closed firm, 50% thinned, mid-position -2 gestation        Extremities: Edema: Trace  Fetal Status: Fetal Heart Rate (bpm): 153 Fundal Height: 34 cm Movement: Present    Results for orders placed or performed in visit on 12/21/18 (from the past  24 hour(s))  POC Urinalysis Dipstick OB   Collection Time: 12/21/18 11:09 AM  Result Value Ref Range   Color, UA     Clarity, UA     Glucose, UA Negative Negative   Bilirubin, UA     Ketones, UA neg    Spec Grav, UA     Blood, UA neg    pH, UA     POC,PROTEIN,UA Trace Negative, Trace, Small (1+), Moderate (2+), Large (3+), 4+   Urobilinogen, UA     Nitrite, UA neg    Leukocytes, UA Negative Negative   Appearance     Odor      Assessment & Plan:  1) Low-risk pregnancy G1P0000 at 357w3dith an Estimated Date of Delivery: 01/22/19   2) GHTN    Meds: No orders of the defined types were placed in this encounter.  Labs/procedures today: NST  Plan:  CBC, CMET, Protein & Creatinine            D/c prometrium,             Has some left over procardia pills for use prn comfort measures. Reviewed: Preterm labor symptoms and general obstetric precautions including but not limited to vaginal bleeding, contractions, leaking of fluid and fetal movement were reviewed in detail with the patient.  All questions were answered.  Follow-up: No follow-ups on file.   Orders Placed This Encounter  Procedures  . CBC  . Comp Met (CMET)  . Protein / creatinine ratio, urine  . POC Urinalysis Dipstick OB   By signing my name below, I, MaSamul Dadaattest that this documentation has been prepared  under the direction and in the presence of Jonnie Kind, MD. Electronically Signed: [place]. 12/21/18. 11:25 AM.  I personally performed the services described in this documentation, which was SCRIBED in my presence. The recorded information has been reviewed and considered accurate. It has been edited as necessary during review. Jonnie Kind, MD

## 2018-12-22 LAB — PROTEIN / CREATININE RATIO, URINE
Creatinine, Urine: 98.9 mg/dL
Protein, Ur: 41.9 mg/dL
Protein/Creat Ratio: 424 mg/g creat — ABNORMAL HIGH (ref 0–200)

## 2018-12-22 LAB — CBC
Hematocrit: 30.6 % — ABNORMAL LOW (ref 34.0–46.6)
Hemoglobin: 10.4 g/dL — ABNORMAL LOW (ref 11.1–15.9)
MCH: 27.7 pg (ref 26.6–33.0)
MCHC: 34 g/dL (ref 31.5–35.7)
MCV: 81 fL (ref 79–97)
Platelets: 208 10*3/uL (ref 150–450)
RBC: 3.76 x10E6/uL — ABNORMAL LOW (ref 3.77–5.28)
RDW: 11.6 % — ABNORMAL LOW (ref 11.7–15.4)
WBC: 6.8 10*3/uL (ref 3.4–10.8)

## 2018-12-22 LAB — COMPREHENSIVE METABOLIC PANEL
ALT: 8 IU/L (ref 0–32)
AST: 12 IU/L (ref 0–40)
Albumin/Globulin Ratio: 1.7 (ref 1.2–2.2)
Albumin: 3.8 g/dL — ABNORMAL LOW (ref 3.9–5.0)
Alkaline Phosphatase: 72 IU/L (ref 39–117)
BUN/Creatinine Ratio: 10 (ref 9–23)
BUN: 4 mg/dL — ABNORMAL LOW (ref 6–20)
Bilirubin Total: 0.2 mg/dL (ref 0.0–1.2)
CO2: 18 mmol/L — ABNORMAL LOW (ref 20–29)
Calcium: 8.4 mg/dL — ABNORMAL LOW (ref 8.7–10.2)
Chloride: 106 mmol/L (ref 96–106)
Creatinine, Ser: 0.39 mg/dL — ABNORMAL LOW (ref 0.57–1.00)
GFR calc Af Amer: 175 mL/min/{1.73_m2} (ref >59)
GFR calc non Af Amer: 152 mL/min/{1.73_m2} (ref >59)
Globulin, Total: 2.3 g/dL (ref 1.5–4.5)
Glucose: 73 mg/dL (ref 65–99)
Potassium: 3.4 mmol/L — ABNORMAL LOW (ref 3.5–5.2)
Sodium: 139 mmol/L (ref 134–144)
Total Protein: 6.1 g/dL (ref 6.0–8.5)

## 2018-12-25 ENCOUNTER — Ambulatory Visit (INDEPENDENT_AMBULATORY_CARE_PROVIDER_SITE_OTHER): Payer: Medicaid Other | Admitting: Obstetrics & Gynecology

## 2018-12-25 ENCOUNTER — Encounter: Payer: Self-pay | Admitting: Obstetrics & Gynecology

## 2018-12-25 ENCOUNTER — Other Ambulatory Visit: Payer: Self-pay

## 2018-12-25 VITALS — BP 139/94 | HR 96 | Wt 145.0 lb

## 2018-12-25 DIAGNOSIS — O1403 Mild to moderate pre-eclampsia, third trimester: Secondary | ICD-10-CM

## 2018-12-25 DIAGNOSIS — O0993 Supervision of high risk pregnancy, unspecified, third trimester: Secondary | ICD-10-CM

## 2018-12-25 DIAGNOSIS — Z1389 Encounter for screening for other disorder: Secondary | ICD-10-CM

## 2018-12-25 DIAGNOSIS — O0990 Supervision of high risk pregnancy, unspecified, unspecified trimester: Secondary | ICD-10-CM

## 2018-12-25 DIAGNOSIS — Z3A36 36 weeks gestation of pregnancy: Secondary | ICD-10-CM | POA: Diagnosis not present

## 2018-12-25 DIAGNOSIS — Z331 Pregnant state, incidental: Secondary | ICD-10-CM

## 2018-12-25 DIAGNOSIS — O4703 False labor before 37 completed weeks of gestation, third trimester: Secondary | ICD-10-CM

## 2018-12-25 DIAGNOSIS — Z3483 Encounter for supervision of other normal pregnancy, third trimester: Secondary | ICD-10-CM

## 2018-12-25 LAB — POCT URINALYSIS DIPSTICK OB
Blood, UA: NEGATIVE
Glucose, UA: NEGATIVE
Ketones, UA: NEGATIVE
Leukocytes, UA: NEGATIVE
Nitrite, UA: NEGATIVE
POC,PROTEIN,UA: NEGATIVE

## 2018-12-25 NOTE — Progress Notes (Signed)
   HIGH-RISK PREGNANCY VISIT Patient name: Betty Harrison MRN [id number]  Date of birth: April 22, 2000 Chief Complaint:   High Risk Gestation (NST/ gbs-gc-chl)  History of Present Illness:   Betty Harrison is a 18 y.o. G62P0000 female at [redacted]w[redacted]d with an Estimated Date of Delivery: 01/22/19 being seen today for ongoing management of a high-risk pregnancy complicated by mild pre eclampsia Today she reports no complaints. Contractions: Irregular. Vag. Bleeding: None.  Movement: Present. denies leaking of fluid.  Review of Systems:   Pertinent items are noted in HPI Denies abnormal vaginal discharge w/ itching/odor/irritation, headaches, visual changes, shortness of breath, chest pain, abdominal pain, severe nausea/vomiting, or problems with urination or bowel movements unless otherwise stated above. Pertinent History Reviewed:  Reviewed past medical,surgical, social, obstetrical and family history.  Reviewed problem list, medications and allergies. Physical Assessment:   Vitals:   12/25/18 1427  BP: (!) 139/94  Pulse: 96  Weight: 145 lb (65.8 kg)  Body mass index is 24.89 kg/m.           Physical Examination:   General appearance: alert, well appearing, and in no distress  Mental status: alert, oriented to person, place, and time  Skin: warm & dry   Extremities: Edema: Trace    Cardiovascular: normal heart rate noted  Respiratory: normal respiratory effort, no distress  Abdomen: gravid, soft, non-tender  Pelvic: Cervical exam performed  Dilation: 1 Effacement (%): 60 Station: -2  Fetal Status:     Movement: Present    Fetal Surveillance Testing today: Reactive NST   Results for orders placed or performed in visit on 12/25/18 (from the past 24 hour(s))  POC Urinalysis Dipstick OB   Collection Time: 12/25/18  2:33 PM  Result Value Ref Range   Color, UA     Clarity, UA     Glucose, UA Negative Negative   Bilirubin, UA     Ketones, UA neg    Spec Grav, UA     Blood, UA neg    pH, UA      POC,PROTEIN,UA Negative Negative, Trace, Small (1+), Moderate (2+), Large (3+), 4+   Urobilinogen, UA     Nitrite, UA neg    Leukocytes, UA Negative Negative   Appearance     Odor      Assessment & Plan:  1) High-risk pregnancy G1P0000 at [redacted]w[redacted]d with an Estimated Date of Delivery: 01/22/19   2) Mild pre eclampsia, stable, reactive NST    Meds: No orders of the defined types were placed in this encounter.   Labs/procedures today:   Treatment Plan:  IOL 37 weeks  Reviewed: Term labor symptoms and general obstetric precautions including but not limited to vaginal bleeding, contractions, leaking of fluid and fetal movement were reviewed in detail with the patient.  All questions were answered.  home bp cuff. Rx faxed to . Check bp weekly, let us know if >140/90.   Follow-up: Return in about 3 days (around 12/28/2018) for BPP/sono, HROB.  Orders Placed This Encounter  Procedures  . Strep Gp B NAA  . GC/Chlamydia Probe Amp  . POC Urinalysis Dipstick OB   Florian Buff  12/25/2018 3:48 PM

## 2018-12-26 ENCOUNTER — Other Ambulatory Visit: Payer: Self-pay | Admitting: Obstetrics & Gynecology

## 2018-12-26 DIAGNOSIS — O133 Gestational [pregnancy-induced] hypertension without significant proteinuria, third trimester: Secondary | ICD-10-CM

## 2018-12-27 LAB — STREP GP B NAA: Strep Gp B NAA: POSITIVE — AB

## 2018-12-28 ENCOUNTER — Other Ambulatory Visit: Payer: Self-pay | Admitting: Advanced Practice Midwife

## 2018-12-28 ENCOUNTER — Ambulatory Visit (INDEPENDENT_AMBULATORY_CARE_PROVIDER_SITE_OTHER): Payer: Medicaid Other

## 2018-12-28 ENCOUNTER — Other Ambulatory Visit: Payer: Self-pay | Admitting: Obstetrics and Gynecology

## 2018-12-28 ENCOUNTER — Other Ambulatory Visit: Payer: Self-pay | Admitting: Obstetrics & Gynecology

## 2018-12-28 ENCOUNTER — Encounter: Payer: Self-pay | Admitting: Obstetrics and Gynecology

## 2018-12-28 ENCOUNTER — Other Ambulatory Visit: Payer: Self-pay

## 2018-12-28 ENCOUNTER — Ambulatory Visit (INDEPENDENT_AMBULATORY_CARE_PROVIDER_SITE_OTHER): Payer: Medicaid Other | Admitting: Obstetrics and Gynecology

## 2018-12-28 VITALS — BP 130/80 | HR 78 | Wt 144.4 lb

## 2018-12-28 DIAGNOSIS — O0990 Supervision of high risk pregnancy, unspecified, unspecified trimester: Secondary | ICD-10-CM

## 2018-12-28 DIAGNOSIS — O0993 Supervision of high risk pregnancy, unspecified, third trimester: Secondary | ICD-10-CM

## 2018-12-28 DIAGNOSIS — Z3A36 36 weeks gestation of pregnancy: Secondary | ICD-10-CM

## 2018-12-28 DIAGNOSIS — O133 Gestational [pregnancy-induced] hypertension without significant proteinuria, third trimester: Secondary | ICD-10-CM | POA: Diagnosis not present

## 2018-12-28 DIAGNOSIS — Z1389 Encounter for screening for other disorder: Secondary | ICD-10-CM

## 2018-12-28 DIAGNOSIS — Z331 Pregnant state, incidental: Secondary | ICD-10-CM

## 2018-12-28 LAB — POCT URINALYSIS DIPSTICK OB
Glucose, UA: NEGATIVE
Ketones, UA: NEGATIVE
Leukocytes, UA: NEGATIVE
Nitrite, UA: NEGATIVE

## 2018-12-28 LAB — GC/CHLAMYDIA PROBE AMP
Chlamydia trachomatis, NAA: NEGATIVE
Neisseria Gonorrhoeae by PCR: NEGATIVE

## 2018-12-28 NOTE — Progress Notes (Signed)
[place] 16+9 wks,cephalic,anterior placenta gr 1,BPP 8/8,AFI 8 cm,RI .58,.56,.59,.58=56%,EFW 3072 g 67%

## 2018-12-28 NOTE — Progress Notes (Signed)
Patient ID: Esmond Harps, female   DOB: January 04, 2001, 18 y.o.   MRN: [id number]    [facility] PREGNANCY VISIT Patient name: Betty Harrison    MRN [id number]  Date of birth: Dec 28, 2000 Chief Complaint:   High Risk Gestation ([place] today)  History of Present Illness:   Betty Harrison is a 18 y.o. G62P0000 female at [redacted]w[redacted]d with an Estimated Date of Delivery: 01/22/19 being seen today for ongoing management of a high-risk pregnancy complicated by preeclampsia currently on procardia 30 mg bid.. IOL 01/01/2019 37 weeks due to pre-e. Today she reports no complaints. Contractions: Not present. Vag. Bleeding: None.  Movement: Present. denies leaking of fluid.  Review of Systems:   Pertinent items are noted in HPI Denies abnormal vaginal discharge w/ itching/odor/irritation, headaches, visual changes, shortness of breath, chest pain, abdominal pain, severe nausea/vomiting, or problems with urination or bowel movements unless otherwise stated above. Pertinent History Reviewed:  Reviewed past medical,surgical, social, obstetrical and family history.  Reviewed problem list, medications and allergies. Physical Assessment:       Vitals:   12/28/18 1342 12/28/18 1356  BP: (!) 141/87 130/80  Pulse: 85 78  Weight: 144 lb 6.4 oz (65.5 kg)   Body mass index is 24.79 kg/m.           Physical Examination:              General appearance: alert, well appearing, and in no distress             Mental status: alert, oriented to person, place, and time, normal mood, behavior, speech, dress, motor activity, and thought processes             Skin: warm & dry              Extremities: Edema: Trace              Cardiovascular: normal heart rate noted             Respiratory: normal respiratory effort, no distress             Abdomen: gravid, soft, non-tender             Pelvic: Cervical exam performed 1 cm, vertex, midposition, firm, 50%effaced.-1 station        Fetal Status:     Movement: Present    Fetal  Surveillance Testing today: [place] 36+3 wks,cephalic,anterior placenta gr 1,BPP 8/8,AFI 8 cm,RI .58,.56,.59,.58=56%,EFW 3072 g 67%        Results for orders placed or performed in visit on 12/28/18 (from the past 24 hour(s))  POC Urinalysis Dipstick OB   Collection Time: 12/28/18  1:32 PM  Result Value Ref Range   Color, UA     Clarity, UA     Glucose, UA Negative Negative   Bilirubin, UA     Ketones, UA neg    Spec Grav, UA     Blood, UA trace    pH, UA     POC,PROTEIN,UA Small (1+) Negative, Trace, Small (1+), Moderate (2+), Large (3+), 4+   Urobilinogen, UA     Nitrite, UA neg    Leukocytes, UA Negative Negative   Appearance     Odor      Assessment & Plan:  1) High-risk pregnancy G1P0000 at [redacted]w[redacted]d with an Estimated Date of Delivery: 01/22/19   2) mild pre-e, stable  Meds: No orders of the defined types were placed in this encounter.  Labs/procedures today: BPP  Treatment Plan:  IOL 37  WEEK, DUE TO PRE-E. Would be a good foley bulb candidate. Will schedule for NST/ foley bulb placement in office on Tuesday at 37.0 with admission that night midnight.  Reviewed: Term labor symptoms and general obstetric precautions including but not limited to vaginal bleeding, contractions, leaking of fluid and fetal movement were reviewed in detail with the patient.  All questions were answered.   Follow-up: No follow-ups on file.     Orders Placed This Encounter  Procedures  . POC Urinalysis Dipstick OB   By signing my name below, I, Samul Dada, attest that this documentation has been prepared under the direction and in the presence of Jonnie Kind, MD. Electronically Signed: [place]. 12/28/18. 2:11 PM.  I personally performed the services described in this documentation, which was SCRIBED in my presence. The recorded information has been reviewed and considered accurate. It has been edited as necessary during review.  Jonnie Kind, MD  Will schedule for FB placement Tuesday pm.in office with iol midnight Wednesday morning

## 2018-12-28 NOTE — Progress Notes (Addendum)
Patient ID: Esmond Harps, female   DOB: 2000/12/20, 18 y.o.   MRN: [id number]    [facility] PREGNANCY VISIT Patient name: Betty Harrison MRN [id number]  Date of birth: [birth year] Chief Complaint:   High Risk Gestation ([place] today)  History of Present Illness:   Betty Harrison is a 18 y.o. G28P0000 female at [redacted]w[redacted]d with an Estimated Date of Delivery: 01/22/19 being seen today for ongoing management of a high-risk pregnancy complicated by preeclampsia currently on procardia 30 mg bid.. IOL 01/01/2019 37 weeks due to pre-e. Today she reports no complaints. Contractions: Not present. Vag. Bleeding: None.  Movement: Present. denies leaking of fluid.  Review of Systems:   Pertinent items are noted in HPI Denies abnormal vaginal discharge w/ itching/odor/irritation, headaches, visual changes, shortness of breath, chest pain, abdominal pain, severe nausea/vomiting, or problems with urination or bowel movements unless otherwise stated above. Pertinent History Reviewed:  Reviewed past medical,surgical, social, obstetrical and family history.  Reviewed problem list, medications and allergies. Physical Assessment:   Vitals:   12/28/18 1342 12/28/18 1356  BP: (!) 141/87 130/80  Pulse: 85 78  Weight: 144 lb 6.4 oz (65.5 kg)   Body mass index is 24.79 kg/m.           Physical Examination:   General appearance: alert, well appearing, and in no distress  Mental status: alert, oriented to person, place, and time, normal mood, behavior, speech, dress, motor activity, and thought processes  Skin: warm & dry   Extremities: Edema: Trace    Cardiovascular: normal heart rate noted  Respiratory: normal respiratory effort, no distress  Abdomen: gravid, soft, non-tender  Pelvic: Cervical exam performed 1 cm, vertex, midposition, firm, 50%effaced.-1 station        Fetal Status:     Movement: Present    Fetal Surveillance Testing today: [place] 36+3 wks,cephalic,anterior placenta gr 1,BPP 8/8,AFI 8 cm,RI  .58,.56,.59,.58=56%,EFW 3072 g 67%   Results for orders placed or performed in visit on 12/28/18 (from the past 24 hour(s))  POC Urinalysis Dipstick OB   Collection Time: 12/28/18  1:32 PM  Result Value Ref Range   Color, UA     Clarity, UA     Glucose, UA Negative Negative   Bilirubin, UA     Ketones, UA neg    Spec Grav, UA     Blood, UA trace    pH, UA     POC,PROTEIN,UA Small (1+) Negative, Trace, Small (1+), Moderate (2+), Large (3+), 4+   Urobilinogen, UA     Nitrite, UA neg    Leukocytes, UA Negative Negative   Appearance     Odor      Assessment & Plan:  1) High-risk pregnancy G1P0000 at [redacted]w[redacted]d with an Estimated Date of Delivery: 01/22/19   2) mild pre-e, stable  Meds: No orders of the defined types were placed in this encounter.  Labs/procedures today: BPP  Treatment Plan:  IOL 37 WEEK, DUE TO PRE-E. Would be a good foley bulb candidate. Will schedule for NST/ foley bulb placement in office on Tuesday at 37.0 with admission that night midnight.  Reviewed: Term labor symptoms and general obstetric precautions including but not limited to vaginal bleeding, contractions, leaking of fluid and fetal movement were reviewed in detail with the patient.  All questions were answered.   Follow-up: No follow-ups on file.  Orders Placed This Encounter  Procedures  . POC Urinalysis Dipstick OB   By signing my name below, I, Arnette Norris, attest that this documentation has  been prepared under the direction and in the presence of Jonnie Kind, MD. Electronically Signed: [place]. 12/28/18. 2:11 PM.  I personally performed the services described in this documentation, which was SCRIBED in my presence. The recorded information has been reviewed and considered accurate. It has been edited as necessary during review. Jonnie Kind, MD

## 2018-12-31 ENCOUNTER — Other Ambulatory Visit: Payer: Self-pay

## 2018-12-31 ENCOUNTER — Ambulatory Visit (HOSPITAL_COMMUNITY)
Admission: RE | Admit: 2018-12-31 | Discharge: 2018-12-31 | Disposition: A | Discharge status: Home / Self Care | Payer: Medicaid Other | Source: Ambulatory Visit | Attending: Obstetrics & Gynecology | Admitting: Obstetrics & Gynecology

## 2018-12-31 DIAGNOSIS — Z01812 Encounter for preprocedural laboratory examination: Secondary | ICD-10-CM | POA: Insufficient documentation

## 2018-12-31 DIAGNOSIS — Z20828 Contact with and (suspected) exposure to other viral communicable diseases: Secondary | ICD-10-CM | POA: Diagnosis not present

## 2018-12-31 LAB — SARS CORONAVIRUS 2 (TAT 6-24 HRS): SARS Coronavirus 2: NEGATIVE

## 2018-12-31 NOTE — MAU Note (Signed)
Asymptomatic, swab collected. 

## 2019-01-01 ENCOUNTER — Ambulatory Visit (INDEPENDENT_AMBULATORY_CARE_PROVIDER_SITE_OTHER): Payer: Medicaid Other | Admitting: Women's Health

## 2019-01-01 ENCOUNTER — Other Ambulatory Visit: Payer: Self-pay

## 2019-01-01 ENCOUNTER — Encounter: Payer: Self-pay | Admitting: Women's Health

## 2019-01-01 VITALS — BP 140/84 | HR 98 | Wt 145.6 lb

## 2019-01-01 DIAGNOSIS — Z1389 Encounter for screening for other disorder: Secondary | ICD-10-CM

## 2019-01-01 DIAGNOSIS — O0993 Supervision of high risk pregnancy, unspecified, third trimester: Secondary | ICD-10-CM | POA: Diagnosis not present

## 2019-01-01 DIAGNOSIS — O1493 Unspecified pre-eclampsia, third trimester: Secondary | ICD-10-CM | POA: Diagnosis not present

## 2019-01-01 DIAGNOSIS — Z331 Pregnant state, incidental: Secondary | ICD-10-CM

## 2019-01-01 LAB — POCT URINALYSIS DIPSTICK OB
Blood, UA: NEGATIVE
Glucose, UA: NEGATIVE
Ketones, UA: NEGATIVE
Leukocytes, UA: NEGATIVE
Nitrite, UA: NEGATIVE

## 2019-01-01 NOTE — Progress Notes (Signed)
HIGH-RISK PREGNANCY VISIT Patient name: Betty Harrison MRN [id number]  Date of birth: [birth year] Chief Complaint:   Routine Prenatal Visit  History of Present Illness:   Betty Harrison is a 18 y.o. G31P0000 female at [redacted]w[redacted]d with an Estimated Date of Delivery: 01/22/19 being seen today for ongoing management of a high-risk pregnancy complicated by preeclampsia currently on procardia 30mg  XL BID (this was given earlier in pregnancy for preterm contractions).  Today she reports no complaints. Denies ha, visual changes, ruq/epigastric pain, n/v.  IOL scheduled for tonight at midnight. Was scheduled for foley bulb insertion today in office, however pre-e is in exclusion criteria. Contractions: Irregular. Vag. Bleeding: None.  Movement: Present. denies leaking of fluid.  Review of Systems:   Pertinent items are noted in HPI Denies abnormal vaginal discharge w/ itching/odor/irritation, headaches, visual changes, shortness of breath, chest pain, abdominal pain, severe nausea/vomiting, or problems with urination or bowel movements unless otherwise stated above. Pertinent History Reviewed:  Reviewed past medical,surgical, social, obstetrical and family history.  Reviewed problem list, medications and allergies. Physical Assessment:   Vitals:   01/01/19 1336  BP: 140/84  Pulse: 98  Weight: 145 lb 9.6 oz (66 kg)  Body mass index is 24.99 kg/m.           Physical Examination:   General appearance: alert, well appearing, and in no distress  Mental status: alert, oriented to person, place, and time  Skin: warm & dry   Extremities: Edema: Trace    Cardiovascular: normal heart rate noted  Respiratory: normal respiratory effort, no distress  Abdomen: gravid, soft, non-tender  Pelvic: Cervical exam performed  Dilation: 1 Effacement (%): 80 Station: -2  Fetal Status: Fetal Heart Rate (bpm): 135    Movement: Present Presentation: Vertex  Fetal Surveillance Testing today: NST: FHR baseline 135 bpm,  Variability: moderate, Accelerations:present, Decelerations:  Absent= Cat 1/Reactive Toco: none     Results for orders placed or performed in visit on 01/01/19 (from the past 24 hour(s))  POC Urinalysis Dipstick OB   Collection Time: 01/01/19  1:41 PM  Result Value Ref Range   Color, UA     Clarity, UA     Glucose, UA Negative Negative   Bilirubin, UA     Ketones, UA neg    Spec Grav, UA     Blood, UA neg    pH, UA     POC,PROTEIN,UA Moderate (2+) Negative, Trace, Small (1+), Moderate (2+), Large (3+), 4+   Urobilinogen, UA     Nitrite, UA neg    Leukocytes, UA Negative Negative   Appearance     Odor      Assessment & Plan:  1) High-risk pregnancy G1P0000 at [redacted]w[redacted]d with an Estimated Date of Delivery: 01/22/19   2) Pre-e w/o severe features, stable, last labs 9/18, has IOL scheduled for tonight at midnight. Unable to place outpatient cervical foley bulb today d/t pre-e being on exclusion criteria.  3) Short cx during pregnancy  Meds: No orders of the defined types were placed in this encounter.  Labs/procedures today: nst, sve  Treatment Plan:  IOL as scheduled tonight at midnight  Reviewed: Term labor symptoms and general obstetric precautions including but not limited to vaginal bleeding, contractions, leaking of fluid and fetal movement were reviewed in detail with the patient.  All questions were answered.   Follow-up: Return for to be scheduled after delivery.  Orders Placed This Encounter  Procedures  . POCT Urinalysis Dipstick  . POC Urinalysis Dipstick OB  Cheral Marker CNM, [facility] 01/01/2019 2:46 PM

## 2019-01-01 NOTE — Patient Instructions (Signed)
Betty Harrison, I greatly value your feedback.  If you receive a survey following your visit with [place] today, we appreciate you taking the time to fill it out.  Thanks, Knute Neu, CNM, [facility]  [place]!!! It is now [place] [facility] ([place], [place] [address]) Entrance located off of [place] parking   Go to [email or web address] to register for FREE online childbirth classes    Call the office [phone]) or go to [facility] if:  You begin to have strong, frequent contractions  Your water breaks.  Sometimes it is a big gush of fluid, sometimes it is just a trickle that keeps getting your panties wet or running down your legs  You have vaginal bleeding.  It is normal to have a small amount of spotting if your cervix was checked.   You don't feel your baby moving like normal.  If you don't, get you something to eat and drink and lay down and focus on feeling your baby move.  You should feel at least 10 movements in 2 hours.  If you don't, you should call the office or go to [facility].   Call the office [phone]) or go to [facility] hospital for these signs of pre-eclampsia:  Severe headache that does not go away with Tylenol  Visual changes- seeing spots, double, blurred vision  Pain under your right breast or upper abdomen that does not go away with Tums or heartburn medicine  Nausea and/or vomiting  Severe swelling in your hands, feet, and face      Home Blood Pressure Monitoring for Patients   Your provider has recommended that you check your blood pressure (BP) at least once a week at home. If you do not have a blood pressure cuff at home, one will be provided for you. Contact your provider if you have not received your monitor within 1 week.   Helpful Tips for Accurate Home Blood Pressure Checks  . Don't smoke, exercise, or drink caffeine 30 minutes before checking your BP . Use the restroom  before checking your BP (a full bladder can raise your pressure) . Relax in a comfortable upright chair . Feet on the ground . Left arm resting comfortably on a flat surface at the level of your heart . Legs uncrossed . Back supported . Sit quietly and don't talk . Place the cuff on your bare arm . Adjust snuggly, so that only two fingertips can fit between your skin and the top of the cuff . Check 2 readings separated by at least one minute . Keep a log of your BP readings . For a visual, please reference this diagram: http://ccnc.care/bpdiagram  Provider Name: [facility]     Phone: [phone]  Zone 1: ALL CLEAR  Continue to monitor your symptoms:  . BP reading is less than 140 (top number) or less than 90 (bottom number)  . No right upper stomach pain . No headaches or seeing spots . No feeling nauseated or throwing up . No swelling in face and hands  Zone 2: CAUTION Call your doctor's office for any of the following:  . BP reading is greater than 140 (top number) or greater than 90 (bottom number)  . Stomach pain under your ribs in the middle or right side . Headaches or seeing spots . Feeling nauseated or throwing up . Swelling in face and hands  Zone 3: EMERGENCY  Seek immediate  medical care if you have any of the following:  . BP reading is greater than160 (top number) or greater than 110 (bottom number) . Severe headaches not improving with Tylenol . Serious difficulty catching your breath . Any worsening symptoms from Zone 2

## 2019-01-02 ENCOUNTER — Inpatient Hospital Stay (HOSPITAL_COMMUNITY)
Admission: AD | Admit: 2019-01-02 | Discharge: 2019-01-05 | DRG: 786 | Disposition: A | Discharge status: Home / Self Care | Payer: Medicaid Other | Attending: Family Medicine | Admitting: Family Medicine

## 2019-01-02 ENCOUNTER — Inpatient Hospital Stay (HOSPITAL_COMMUNITY): Payer: Medicaid Other

## 2019-01-02 ENCOUNTER — Encounter (HOSPITAL_COMMUNITY): Payer: Self-pay

## 2019-01-02 ENCOUNTER — Inpatient Hospital Stay (HOSPITAL_COMMUNITY): Payer: Medicaid Other | Admitting: Anesthesiology

## 2019-01-02 ENCOUNTER — Encounter (HOSPITAL_COMMUNITY)
Admission: AD | Disposition: A | Discharge status: Home / Self Care | Payer: Self-pay | Source: Home / Self Care | Attending: Family Medicine

## 2019-01-02 DIAGNOSIS — O1404 Mild to moderate pre-eclampsia, complicating childbirth: Secondary | ICD-10-CM | POA: Diagnosis not present

## 2019-01-02 DIAGNOSIS — D62 Acute posthemorrhagic anemia: Secondary | ICD-10-CM | POA: Diagnosis not present

## 2019-01-02 DIAGNOSIS — O99824 Streptococcus B carrier state complicating childbirth: Secondary | ICD-10-CM | POA: Diagnosis present

## 2019-01-02 DIAGNOSIS — O411230 Chorioamnionitis, third trimester, not applicable or unspecified: Secondary | ICD-10-CM | POA: Diagnosis present

## 2019-01-02 DIAGNOSIS — O9081 Anemia of the puerperium: Secondary | ICD-10-CM | POA: Diagnosis not present

## 2019-01-02 DIAGNOSIS — Z87891 Personal history of nicotine dependence: Secondary | ICD-10-CM

## 2019-01-02 DIAGNOSIS — O4703 False labor before 37 completed weeks of gestation, third trimester: Secondary | ICD-10-CM

## 2019-01-02 DIAGNOSIS — Z3A37 37 weeks gestation of pregnancy: Secondary | ICD-10-CM

## 2019-01-02 DIAGNOSIS — O1493 Unspecified pre-eclampsia, third trimester: Secondary | ICD-10-CM | POA: Diagnosis present

## 2019-01-02 DIAGNOSIS — F419 Anxiety disorder, unspecified: Secondary | ICD-10-CM | POA: Diagnosis present

## 2019-01-02 DIAGNOSIS — O0990 Supervision of high risk pregnancy, unspecified, unspecified trimester: Secondary | ICD-10-CM

## 2019-01-02 DIAGNOSIS — Z8659 Personal history of other mental and behavioral disorders: Secondary | ICD-10-CM

## 2019-01-02 LAB — COMPREHENSIVE METABOLIC PANEL
ALT: 12 U/L (ref 0–44)
AST: 18 U/L (ref 15–41)
Albumin: 3 g/dL — ABNORMAL LOW (ref 3.5–5.0)
Alkaline Phosphatase: 77 U/L (ref 38–126)
Anion gap: 9 (ref 5–15)
BUN: <5 mg/dL — ABNORMAL LOW (ref 6–20)
CO2: 20 mmol/L — ABNORMAL LOW (ref 22–32)
Calcium: 8.5 mg/dL — ABNORMAL LOW (ref 8.9–10.3)
Chloride: 108 mmol/L (ref 98–111)
Creatinine, Ser: 0.52 mg/dL (ref 0.44–1.00)
GFR calc Af Amer: >60 mL/min (ref >60)
GFR calc non Af Amer: >60 mL/min (ref >60)
Glucose, Bld: 81 mg/dL (ref 70–99)
Potassium: 2.8 mmol/L — ABNORMAL LOW (ref 3.5–5.1)
Sodium: 137 mmol/L (ref 135–145)
Total Bilirubin: 0.4 mg/dL (ref 0.3–1.2)
Total Protein: 6.3 g/dL — ABNORMAL LOW (ref 6.5–8.1)

## 2019-01-02 LAB — CBC
HCT: 29.2 % — ABNORMAL LOW (ref 36.0–46.0)
Hemoglobin: 10.1 g/dL — ABNORMAL LOW (ref 12.0–15.0)
MCH: 27.7 pg (ref 26.0–34.0)
MCHC: 34.6 g/dL (ref 30.0–36.0)
MCV: 80.2 fL (ref 80.0–100.0)
Platelets: 199 10*3/uL (ref 150–400)
RBC: 3.64 MIL/uL — ABNORMAL LOW (ref 3.87–5.11)
RDW: 11.9 % (ref 11.5–15.5)
WBC: 8 10*3/uL (ref 4.0–10.5)
nRBC: 0 % (ref 0.0–0.2)

## 2019-01-02 LAB — PROTEIN / CREATININE RATIO, URINE
Creatinine, Urine: 56.48 mg/dL
Protein Creatinine Ratio: 0.44 mg/mg{Cre} — ABNORMAL HIGH (ref 0.00–0.15)
Total Protein, Urine: 25 mg/dL

## 2019-01-02 LAB — TYPE AND SCREEN
ABO/RH(D): A POS
Antibody Screen: NEGATIVE

## 2019-01-02 LAB — ABO/RH: ABO/RH(D): A POS

## 2019-01-02 SURGERY — Surgical Case
Anesthesia: Epidural

## 2019-01-02 MED ORDER — FENTANYL CITRATE (PF) 100 MCG/2ML IJ SOLN
100.0000 ug | INTRAMUSCULAR | Status: DC | PRN
  Administered 2019-01-02 (×2): 100 ug via INTRAVENOUS
  Filled 2019-01-02: qty 2

## 2019-01-02 MED ORDER — MEPERIDINE HCL 25 MG/ML IJ SOLN
INTRAMUSCULAR | Status: DC | PRN
  Administered 2019-01-02: 12.5 mg via INTRAVENOUS

## 2019-01-02 MED ORDER — LIDOCAINE-EPINEPHRINE (PF) 2 %-1:200000 IJ SOLN
INTRAMUSCULAR | Status: AC
  Filled 2019-01-02: qty 10

## 2019-01-02 MED ORDER — ONDANSETRON HCL 4 MG/2ML IJ SOLN
INTRAMUSCULAR | Status: DC | PRN
  Administered 2019-01-02: 4 mg via INTRAVENOUS

## 2019-01-02 MED ORDER — LACTATED RINGERS IV SOLN
INTRAVENOUS | Status: DC
  Administered 2019-01-02: 20:00:00 via INTRAVENOUS

## 2019-01-02 MED ORDER — NALOXONE HCL 0.4 MG/ML IJ SOLN: 0.4000 mg | INTRAMUSCULAR | Status: DC | PRN

## 2019-01-02 MED ORDER — KETOROLAC TROMETHAMINE 30 MG/ML IJ SOLN: 30.0000 mg | Freq: Four times a day (QID) | INTRAMUSCULAR | Status: AC | PRN

## 2019-01-02 MED ORDER — ZOLPIDEM TARTRATE 5 MG PO TABS: 5.0000 mg | ORAL_TABLET | Freq: Every evening | ORAL | Status: DC | PRN

## 2019-01-02 MED ORDER — OXYTOCIN 40 UNITS IN NORMAL SALINE INFUSION - SIMPLE MED: 2.5000 [IU]/h | INTRAVENOUS | Status: AC

## 2019-01-02 MED ORDER — OXYTOCIN BOLUS FROM INFUSION: 500.0000 mL | Freq: Once | INTRAVENOUS | Status: DC

## 2019-01-02 MED ORDER — FLEET ENEMA 7-19 GM/118ML RE ENEM: 1.0000 | ENEMA | RECTAL | Status: DC | PRN

## 2019-01-02 MED ORDER — NALBUPHINE HCL 10 MG/ML IJ SOLN
5.0000 mg | Freq: Once | INTRAMUSCULAR | Status: DC | PRN
  Filled 2019-01-02: qty 0.5

## 2019-01-02 MED ORDER — EPHEDRINE 5 MG/ML INJ: 10.0000 mg | INTRAVENOUS | Status: DC | PRN

## 2019-01-02 MED ORDER — MEPERIDINE HCL 25 MG/ML IJ SOLN
INTRAMUSCULAR | Status: AC
  Filled 2019-01-02: qty 1

## 2019-01-02 MED ORDER — OXYTOCIN 10 UNIT/ML IJ SOLN
INTRAMUSCULAR | Status: DC | PRN
  Administered 2019-01-02: 40 [IU]

## 2019-01-02 MED ORDER — LIDOCAINE HCL (PF) 1 % IJ SOLN: 30.0000 mL | INTRAMUSCULAR | Status: DC | PRN

## 2019-01-02 MED ORDER — ACETAMINOPHEN 10 MG/ML IV SOLN: 1000.0000 mg | Freq: Once | INTRAVENOUS | Status: DC | PRN

## 2019-01-02 MED ORDER — FENTANYL CITRATE (PF) 100 MCG/2ML IJ SOLN: 25.0000 ug | INTRAMUSCULAR | Status: DC | PRN

## 2019-01-02 MED ORDER — FENTANYL-BUPIVACAINE-NACL 0.5-0.125-0.9 MG/250ML-% EP SOLN: 12.0000 mL/h | EPIDURAL | Status: DC | PRN

## 2019-01-02 MED ORDER — KETOROLAC TROMETHAMINE 30 MG/ML IJ SOLN
30.0000 mg | Freq: Four times a day (QID) | INTRAMUSCULAR | Status: AC | PRN
  Administered 2019-01-03 (×2): 30 mg via INTRAVENOUS
  Filled 2019-01-02 (×2): qty 1

## 2019-01-02 MED ORDER — DIPHENHYDRAMINE HCL 50 MG/ML IJ SOLN: 12.5000 mg | INTRAMUSCULAR | Status: DC | PRN

## 2019-01-02 MED ORDER — METOCLOPRAMIDE HCL 5 MG/ML IJ SOLN
INTRAMUSCULAR | Status: AC
  Filled 2019-01-02: qty 2

## 2019-01-02 MED ORDER — DEXAMETHASONE SODIUM PHOSPHATE 4 MG/ML IJ SOLN
INTRAMUSCULAR | Status: AC
  Filled 2019-01-02: qty 1

## 2019-01-02 MED ORDER — ACETAMINOPHEN 160 MG/5ML PO SOLN: 325.0000 mg | Freq: Once | ORAL | Status: DC | PRN

## 2019-01-02 MED ORDER — SCOPOLAMINE 1 MG/3DAYS TD PT72: 1.0000 | MEDICATED_PATCH | Freq: Once | TRANSDERMAL | Status: DC

## 2019-01-02 MED ORDER — SODIUM BICARBONATE 8.4 % IV SOLN
INTRAVENOUS | Status: AC
  Filled 2019-01-02: qty 50

## 2019-01-02 MED ORDER — SODIUM CHLORIDE 0.9 % IR SOLN
Status: DC | PRN
  Administered 2019-01-02: 1

## 2019-01-02 MED ORDER — CHLOROPROCAINE HCL (PF) 3 % IJ SOLN
INTRAMUSCULAR | Status: DC | PRN
  Administered 2019-01-02: 20 mL

## 2019-01-02 MED ORDER — CHLOROPROCAINE HCL (PF) 3 % IJ SOLN
INTRAMUSCULAR | Status: AC
  Filled 2019-01-02: qty 20

## 2019-01-02 MED ORDER — SIMETHICONE 80 MG PO CHEW: 80.0000 mg | CHEWABLE_TABLET | ORAL | Status: DC | PRN

## 2019-01-02 MED ORDER — SCOPOLAMINE 1 MG/3DAYS TD PT72
MEDICATED_PATCH | TRANSDERMAL | Status: AC
  Filled 2019-01-02: qty 1

## 2019-01-02 MED ORDER — OXYTOCIN 40 UNITS IN NORMAL SALINE INFUSION - SIMPLE MED
1.0000 m[IU]/min | INTRAVENOUS | Status: DC
  Administered 2019-01-02 (×2): 2 m[IU]/min via INTRAVENOUS
  Filled 2019-01-02: qty 1000

## 2019-01-02 MED ORDER — DIPHENHYDRAMINE HCL 25 MG PO CAPS: 25.0000 mg | ORAL_CAPSULE | ORAL | Status: DC | PRN

## 2019-01-02 MED ORDER — LACTATED RINGERS IV SOLN: 500.0000 mL | Freq: Once | INTRAVENOUS | Status: DC

## 2019-01-02 MED ORDER — ACETAMINOPHEN 500 MG PO TABS
1000.0000 mg | ORAL_TABLET | Freq: Once | ORAL | Status: AC
  Administered 2019-01-02: 1000 mg via ORAL
  Filled 2019-01-02: qty 2

## 2019-01-02 MED ORDER — FENTANYL-BUPIVACAINE-NACL 0.5-0.125-0.9 MG/250ML-% EP SOLN
EPIDURAL | Status: AC
  Filled 2019-01-02: qty 250

## 2019-01-02 MED ORDER — BUPIVACAINE HCL (PF) 0.25 % IJ SOLN
INTRAMUSCULAR | Status: AC
  Filled 2019-01-02: qty 10

## 2019-01-02 MED ORDER — PRENATAL MULTIVITAMIN CH
1.0000 | ORAL_TABLET | Freq: Every day | ORAL | Status: DC
  Administered 2019-01-03 – 2019-01-05 (×3): 1 via ORAL
  Filled 2019-01-02 (×3): qty 1

## 2019-01-02 MED ORDER — NALBUPHINE HCL 10 MG/ML IJ SOLN
5.0000 mg | INTRAMUSCULAR | Status: DC | PRN
  Filled 2019-01-02: qty 0.5

## 2019-01-02 MED ORDER — FENTANYL CITRATE (PF) 100 MCG/2ML IJ SOLN
INTRAMUSCULAR | Status: AC
  Filled 2019-01-02: qty 2

## 2019-01-02 MED ORDER — MORPHINE SULFATE (PF) 0.5 MG/ML IJ SOLN
INTRAMUSCULAR | Status: DC | PRN
  Administered 2019-01-02: 3 mg via EPIDURAL

## 2019-01-02 MED ORDER — SODIUM CHLORIDE (PF) 0.9 % IJ SOLN
INTRAMUSCULAR | Status: DC | PRN
  Administered 2019-01-02: 12 mL/h via EPIDURAL

## 2019-01-02 MED ORDER — OXYCODONE HCL 5 MG PO TABS
5.0000 mg | ORAL_TABLET | ORAL | Status: DC | PRN
  Administered 2019-01-03: 5 mg via ORAL
  Administered 2019-01-04 – 2019-01-05 (×3): 10 mg via ORAL
  Filled 2019-01-02: qty 1
  Filled 2019-01-02 (×3): qty 2

## 2019-01-02 MED ORDER — MENTHOL 3 MG MT LOZG: 1.0000 | LOZENGE | OROMUCOSAL | Status: DC | PRN

## 2019-01-02 MED ORDER — ACETAMINOPHEN 325 MG PO TABS: 325.0000 mg | ORAL_TABLET | Freq: Once | ORAL | Status: DC | PRN

## 2019-01-02 MED ORDER — TRANEXAMIC ACID-NACL 1000-0.7 MG/100ML-% IV SOLN
INTRAVENOUS | Status: AC
  Filled 2019-01-02: qty 100

## 2019-01-02 MED ORDER — DIPHENHYDRAMINE HCL 25 MG PO CAPS: 25.0000 mg | ORAL_CAPSULE | Freq: Four times a day (QID) | ORAL | Status: DC | PRN

## 2019-01-02 MED ORDER — TETANUS-DIPHTH-ACELL PERTUSSIS 5-2.5-18.5 LF-MCG/0.5 IM SUSP: 0.5000 mL | Freq: Once | INTRAMUSCULAR | Status: DC

## 2019-01-02 MED ORDER — OXYTOCIN 40 UNITS IN NORMAL SALINE INFUSION - SIMPLE MED
INTRAVENOUS | Status: AC
  Filled 2019-01-02: qty 1000

## 2019-01-02 MED ORDER — LACTATED RINGERS IV SOLN
INTRAVENOUS | Status: DC
  Administered 2019-01-02 (×2): via INTRAVENOUS

## 2019-01-02 MED ORDER — PROMETHAZINE HCL 25 MG/ML IJ SOLN: 6.2500 mg | INTRAMUSCULAR | Status: DC | PRN

## 2019-01-02 MED ORDER — FENTANYL CITRATE (PF) 100 MCG/2ML IJ SOLN
INTRAMUSCULAR | Status: DC | PRN
  Administered 2019-01-02 (×2): 50 ug via EPIDURAL

## 2019-01-02 MED ORDER — SODIUM CHLORIDE 0.9 % IV SOLN
2.0000 g | Freq: Four times a day (QID) | INTRAVENOUS | Status: DC
  Administered 2019-01-02: 2 g via INTRAVENOUS
  Filled 2019-01-02: qty 2000

## 2019-01-02 MED ORDER — MEPERIDINE HCL 25 MG/ML IJ SOLN: 6.2500 mg | INTRAMUSCULAR | Status: DC | PRN

## 2019-01-02 MED ORDER — FENTANYL CITRATE (PF) 250 MCG/5ML IJ SOLN
INTRAMUSCULAR | Status: DC | PRN
  Administered 2019-01-02: 150 ug via INTRAVENOUS

## 2019-01-02 MED ORDER — PHENYLEPHRINE 40 MCG/ML (10ML) SYRINGE FOR IV PUSH (FOR BLOOD PRESSURE SUPPORT): 80.0000 ug | PREFILLED_SYRINGE | INTRAVENOUS | Status: DC | PRN

## 2019-01-02 MED ORDER — LACTATED RINGERS AMNIOINFUSION
INTRAVENOUS | Status: DC
  Administered 2019-01-02: 11:00:00 via INTRAUTERINE

## 2019-01-02 MED ORDER — TERBUTALINE SULFATE 1 MG/ML IJ SOLN
0.2500 mg | Freq: Once | INTRAMUSCULAR | Status: AC | PRN
  Administered 2019-01-02 (×2): 0.25 mg via SUBCUTANEOUS
  Filled 2019-01-02: qty 1

## 2019-01-02 MED ORDER — SENNOSIDES-DOCUSATE SODIUM 8.6-50 MG PO TABS
2.0000 | ORAL_TABLET | ORAL | Status: DC
  Administered 2019-01-03 – 2019-01-04 (×2): 2 via ORAL
  Filled 2019-01-02 (×2): qty 2

## 2019-01-02 MED ORDER — ACETAMINOPHEN 500 MG PO TABS
1000.0000 mg | ORAL_TABLET | Freq: Four times a day (QID) | ORAL | Status: DC
  Administered 2019-01-03 – 2019-01-04 (×6): 1000 mg via ORAL
  Filled 2019-01-02 (×7): qty 2

## 2019-01-02 MED ORDER — WITCH HAZEL-GLYCERIN EX PADS: 1.0000 "application " | MEDICATED_PAD | CUTANEOUS | Status: DC | PRN

## 2019-01-02 MED ORDER — MORPHINE SULFATE (PF) 0.5 MG/ML IJ SOLN
INTRAMUSCULAR | Status: AC
  Filled 2019-01-02: qty 10

## 2019-01-02 MED ORDER — LACTATED RINGERS IV SOLN: INTRAVENOUS | Status: DC

## 2019-01-02 MED ORDER — SCOPOLAMINE 1 MG/3DAYS TD PT72
MEDICATED_PATCH | TRANSDERMAL | Status: DC | PRN
  Administered 2019-01-02: 1 via TRANSDERMAL

## 2019-01-02 MED ORDER — SODIUM CHLORIDE 0.9% FLUSH: 3.0000 mL | INTRAVENOUS | Status: DC | PRN

## 2019-01-02 MED ORDER — ONDANSETRON HCL 4 MG/2ML IJ SOLN
4.0000 mg | Freq: Four times a day (QID) | INTRAMUSCULAR | Status: DC | PRN
  Administered 2019-01-02: 4 mg via INTRAVENOUS
  Filled 2019-01-02 (×2): qty 2

## 2019-01-02 MED ORDER — ONDANSETRON HCL 4 MG/2ML IJ SOLN: 4.0000 mg | Freq: Three times a day (TID) | INTRAMUSCULAR | Status: DC | PRN

## 2019-01-02 MED ORDER — FENTANYL CITRATE (PF) 250 MCG/5ML IJ SOLN
INTRAMUSCULAR | Status: AC
  Filled 2019-01-02: qty 5

## 2019-01-02 MED ORDER — SIMETHICONE 80 MG PO CHEW
80.0000 mg | CHEWABLE_TABLET | Freq: Three times a day (TID) | ORAL | Status: DC
  Administered 2019-01-03 – 2019-01-05 (×7): 80 mg via ORAL
  Filled 2019-01-02 (×8): qty 1

## 2019-01-02 MED ORDER — OXYCODONE-ACETAMINOPHEN 5-325 MG PO TABS: 1.0000 | ORAL_TABLET | ORAL | Status: DC | PRN

## 2019-01-02 MED ORDER — LACTATED RINGERS IV SOLN
INTRAVENOUS | Status: DC | PRN
  Administered 2019-01-02: 16:00:00 via INTRAVENOUS

## 2019-01-02 MED ORDER — NALOXONE HCL 4 MG/10ML IJ SOLN
1.0000 ug/kg/h | INTRAVENOUS | Status: DC | PRN
  Filled 2019-01-02: qty 5

## 2019-01-02 MED ORDER — ONDANSETRON HCL 4 MG/2ML IJ SOLN
INTRAMUSCULAR | Status: AC
  Filled 2019-01-02: qty 2

## 2019-01-02 MED ORDER — LIDOCAINE HCL (PF) 1 % IJ SOLN
INTRAMUSCULAR | Status: DC | PRN
  Administered 2019-01-02 (×2): 5 mL via EPIDURAL

## 2019-01-02 MED ORDER — OXYTOCIN 40 UNITS IN NORMAL SALINE INFUSION - SIMPLE MED: 2.5000 [IU]/h | INTRAVENOUS | Status: DC

## 2019-01-02 MED ORDER — FENTANYL CITRATE (PF) 100 MCG/2ML IJ SOLN
100.0000 ug | Freq: Once | INTRAMUSCULAR | Status: AC
  Administered 2019-01-02: 03:00:00 100 ug via INTRAVENOUS

## 2019-01-02 MED ORDER — ACETAMINOPHEN 325 MG PO TABS: 650.0000 mg | ORAL_TABLET | ORAL | Status: DC | PRN

## 2019-01-02 MED ORDER — SODIUM BICARBONATE 8.4 % IV SOLN
INTRAVENOUS | Status: DC | PRN
  Administered 2019-01-02 (×5): 5 mL via EPIDURAL

## 2019-01-02 MED ORDER — SODIUM CHLORIDE 0.9 % IV SOLN
5.0000 10*6.[IU] | Freq: Once | INTRAVENOUS | Status: AC
  Administered 2019-01-02: 02:00:00 5 10*6.[IU] via INTRAVENOUS
  Filled 2019-01-02: qty 5

## 2019-01-02 MED ORDER — COCONUT OIL OIL: 1.0000 "application " | TOPICAL_OIL | Status: DC | PRN

## 2019-01-02 MED ORDER — DIBUCAINE (PERIANAL) 1 % EX OINT: 1.0000 "application " | TOPICAL_OINTMENT | CUTANEOUS | Status: DC | PRN

## 2019-01-02 MED ORDER — BUPIVACAINE HCL 0.25 % IJ SOLN
INTRAMUSCULAR | Status: DC | PRN
  Administered 2019-01-02: 10 mL

## 2019-01-02 MED ORDER — SOD CITRATE-CITRIC ACID 500-334 MG/5ML PO SOLN
30.0000 mL | ORAL | Status: DC | PRN
  Administered 2019-01-02: 16:00:00 30 mL via ORAL
  Filled 2019-01-02: qty 30

## 2019-01-02 MED ORDER — GENTAMICIN SULFATE 40 MG/ML IJ SOLN
5.0000 mg/kg | INTRAVENOUS | Status: DC
  Administered 2019-01-02: 300 mg via INTRAVENOUS
  Filled 2019-01-02: qty 7.5

## 2019-01-02 MED ORDER — PENICILLIN G 3 MILLION UNITS IVPB - SIMPLE MED
3.0000 10*6.[IU] | INTRAVENOUS | Status: DC
  Administered 2019-01-02 (×3): 3 10*6.[IU] via INTRAVENOUS
  Filled 2019-01-02 (×3): qty 100

## 2019-01-02 MED ORDER — SIMETHICONE 80 MG PO CHEW
80.0000 mg | CHEWABLE_TABLET | ORAL | Status: DC
  Administered 2019-01-03 – 2019-01-04 (×3): 80 mg via ORAL
  Filled 2019-01-02 (×2): qty 1

## 2019-01-02 MED ORDER — LACTATED RINGERS IV SOLN
500.0000 mL | INTRAVENOUS | Status: DC | PRN
  Administered 2019-01-02: 500 mL via INTRAVENOUS

## 2019-01-02 MED ORDER — SODIUM CHLORIDE 0.9 % IV SOLN
INTRAVENOUS | Status: DC | PRN
  Administered 2019-01-02: 17:00:00 via INTRAVENOUS

## 2019-01-02 MED ORDER — TRANEXAMIC ACID 1000 MG/10ML IV SOLN
INTRAVENOUS | Status: DC | PRN
  Administered 2019-01-02: 17:00:00 1000 mg via INTRAVENOUS

## 2019-01-02 MED ORDER — OXYCODONE-ACETAMINOPHEN 5-325 MG PO TABS: 2.0000 | ORAL_TABLET | ORAL | Status: DC | PRN

## 2019-01-02 SURGICAL SUPPLY — 32 items
BENZOIN TINCTURE PRP APPL 2/3 (GAUZE/BANDAGES/DRESSINGS) ×3 IMPLANT
CHLORAPREP W/TINT 26ML (MISCELLANEOUS) ×3 IMPLANT
CLOSURE STERI STRIP 1/2 X4 (GAUZE/BANDAGES/DRESSINGS) ×2 IMPLANT
CLOSURE WOUND 1/2 X4 (GAUZE/BANDAGES/DRESSINGS) ×1
CLOTH BEACON ORANGE TIMEOUT ST (SAFETY) ×3 IMPLANT
DRSG OPSITE POSTOP 4X10 (GAUZE/BANDAGES/DRESSINGS) ×3 IMPLANT
ELECT REM PT RETURN 9FT ADLT (ELECTROSURGICAL) ×3
ELECTRODE REM PT RTRN 9FT ADLT (ELECTROSURGICAL) ×1 IMPLANT
GLOVE BIOGEL PI IND STRL 7.0 (GLOVE) ×3 IMPLANT
GLOVE BIOGEL PI INDICATOR 7.0 (GLOVE) ×6
GLOVE ECLIPSE 6.5 STRL STRAW (GLOVE) ×3 IMPLANT
GOWN STRL REUS W/ TWL LRG LVL3 (GOWN DISPOSABLE) ×2 IMPLANT
GOWN STRL REUS W/TWL LRG LVL3 (GOWN DISPOSABLE) ×4
NEEDLE HYPO 22GX1.5 SAFETY (NEEDLE) ×3 IMPLANT
NEEDLE HYPO 25X5/8 SAFETYGLIDE (NEEDLE) ×3 IMPLANT
NS IRRIG 1000ML POUR BTL (IV SOLUTION) ×3 IMPLANT
PAD OB MATERNITY 4.3X12.25 (PERSONAL CARE ITEMS) ×3 IMPLANT
PAD PREP 24X48 CUFFED NSTRL (MISCELLANEOUS) ×3 IMPLANT
RETRACTOR WND ALEXIS 25 LRG (MISCELLANEOUS) IMPLANT
RTRCTR WOUND ALEXIS 25CM LRG (MISCELLANEOUS)
SPONGE LAP 18X18 RF (DISPOSABLE) ×9 IMPLANT
STRIP CLOSURE SKIN 1/2X4 (GAUZE/BANDAGES/DRESSINGS) ×2 IMPLANT
SUT PLAIN 2 0 XLH (SUTURE) ×3 IMPLANT
SUT VIC AB 0 CT1 36 (SUTURE) ×6 IMPLANT
SUT VIC AB 2-0 CT1 27 (SUTURE) ×2
SUT VIC AB 2-0 CT1 TAPERPNT 27 (SUTURE) ×1 IMPLANT
SUT VIC AB 4-0 KS 27 (SUTURE) ×3 IMPLANT
SYR 3ML 25GX5/8 SAFETY (SYRINGE) ×3 IMPLANT
SYR CONTROL 10ML LL (SYRINGE) ×3 IMPLANT
TOWEL OR 17X24 6PK STRL BLUE (TOWEL DISPOSABLE) ×9 IMPLANT
TRAY FOLEY CATH SILVER 16FR (SET/KITS/TRAYS/PACK) ×3 IMPLANT
WATER STERILE IRR 1000ML POUR (IV SOLUTION) ×3 IMPLANT

## 2019-01-02 NOTE — Plan of Care (Signed)
Problem: Education: Goal: Knowledge of General Education information will improve Description: Including pain rating scale, medication(s)/side effects and non-pharmacologic comfort measures 01/02/2019 1846 by Vernard Gambles, RN Outcome: Completed/Met 01/02/2019 0926 by Vernard Gambles, RN Outcome: Progressing   Problem: Health Behavior/Discharge Planning: Goal: Ability to manage health-related needs will improve 01/02/2019 1846 by Vernard Gambles, RN Outcome: Completed/Met 01/02/2019 0926 by Vernard Gambles, RN Outcome: Progressing   Problem: Clinical Measurements: Goal: Ability to maintain clinical measurements within normal limits will improve 01/02/2019 1846 by Vernard Gambles, RN Outcome: Completed/Met 01/02/2019 0926 by Vernard Gambles, RN Outcome: Progressing Goal: Will remain free from infection 01/02/2019 1846 by Vernard Gambles, RN Outcome: Completed/Met 01/02/2019 0926 by Vernard Gambles, RN Outcome: Progressing Goal: Diagnostic test results will improve 01/02/2019 1846 by Vernard Gambles, RN Outcome: Completed/Met 01/02/2019 0926 by Vernard Gambles, RN Outcome: Progressing Goal: Respiratory complications will improve 01/02/2019 1846 by Vernard Gambles, RN Outcome: Completed/Met 01/02/2019 0926 by Vernard Gambles, RN Outcome: Progressing Goal: Cardiovascular complication will be avoided 01/02/2019 1846 by Vernard Gambles, RN Outcome: Completed/Met 01/02/2019 0926 by Vernard Gambles, RN Outcome: Progressing   Problem: Activity: Goal: Risk for activity intolerance will decrease 01/02/2019 1846 by Vernard Gambles, RN Outcome: Completed/Met 01/02/2019 0926 by Vernard Gambles, RN Outcome: Progressing   Problem: Nutrition: Goal: Adequate nutrition will be maintained 01/02/2019 1846 by Vernard Gambles, RN Outcome: Completed/Met 01/02/2019 0926 by Vernard Gambles, RN Outcome: Progressing   Problem: Coping: Goal: Level of anxiety will  decrease 01/02/2019 1846 by Vernard Gambles, RN Outcome: Completed/Met 01/02/2019 0926 by Vernard Gambles, RN Outcome: Progressing   Problem: Elimination: Goal: Will not experience complications related to bowel motility 01/02/2019 1846 by Vernard Gambles, RN Outcome: Completed/Met 01/02/2019 0926 by Vernard Gambles, RN Outcome: Progressing Goal: Will not experience complications related to urinary retention 01/02/2019 1846 by Vernard Gambles, RN Outcome: Completed/Met 01/02/2019 0926 by Vernard Gambles, RN Outcome: Progressing   Problem: Pain Managment: Goal: General experience of comfort will improve 01/02/2019 1846 by Vernard Gambles, RN Outcome: Completed/Met 01/02/2019 0926 by Vernard Gambles, RN Outcome: Progressing   Problem: Safety: Goal: Ability to remain free from injury will improve 01/02/2019 1846 by Vernard Gambles, RN Outcome: Completed/Met 01/02/2019 0926 by Vernard Gambles, RN Outcome: Progressing   Problem: Skin Integrity: Goal: Risk for impaired skin integrity will decrease 01/02/2019 1846 by Vernard Gambles, RN Outcome: Completed/Met 01/02/2019 0926 by Vernard Gambles, RN Outcome: Progressing   Problem: Education: Goal: Knowledge of Childbirth will improve 01/02/2019 1846 by Vernard Gambles, RN Outcome: Completed/Met 01/02/2019 0926 by Vernard Gambles, RN Outcome: Progressing Goal: Ability to make informed decisions regarding treatment and plan of care will improve 01/02/2019 1846 by Vernard Gambles, RN Outcome: Completed/Met 01/02/2019 0926 by Vernard Gambles, RN Outcome: Progressing Goal: Ability to state and carry out methods to decrease the pain will improve 01/02/2019 1846 by Vernard Gambles, RN Outcome: Completed/Met 01/02/2019 0926 by Vernard Gambles, RN Outcome: Progressing Goal: Individualized Educational Video(s) 01/02/2019 1846 by Vernard Gambles, RN Outcome: Completed/Met 01/02/2019 0926 by Vernard Gambles,  RN Outcome: Progressing   Problem: Coping: Goal: Ability to verbalize concerns and feelings about labor and delivery will improve 01/02/2019 1846 by Vernard Gambles, RN Outcome: Completed/Met 01/02/2019 0926 by Vernard Gambles, RN Outcome: Progressing   Problem: Life Cycle: Goal: Ability to make normal progression through stages of  labor will improve 01/02/2019 1846 by Vernard Gambles, RN Outcome: Completed/Met 01/02/2019 8937 by Vernard Gambles, RN Outcome: Progressing Goal: Ability to effectively push during vaginal delivery will improve 01/02/2019 1846 by Vernard Gambles, RN Outcome: Completed/Met 01/02/2019 0926 by Vernard Gambles, RN Outcome: Progressing   Problem: Role Relationship: Goal: Will demonstrate positive interactions with the child 01/02/2019 1846 by Vernard Gambles, RN Outcome: Completed/Met 01/02/2019 0926 by Vernard Gambles, RN Outcome: Progressing   Problem: Safety: Goal: Risk of complications during labor and delivery will decrease 01/02/2019 1846 by Vernard Gambles, RN Outcome: Completed/Met 01/02/2019 0926 by Vernard Gambles, RN Outcome: Progressing   Problem: Pain Management: Goal: Relief or control of pain from uterine contractions will improve 01/02/2019 1846 by Vernard Gambles, RN Outcome: Completed/Met 01/02/2019 0926 by Vernard Gambles, RN Outcome: Progressing

## 2019-01-02 NOTE — Progress Notes (Signed)
Betty Harrison is a 18 y.o. G1P0000 at [redacted]w[redacted]d by admitted for induction of labor due to Pre-eclamptic toxemia of pregnancy w/o severe features.   Subjective: Comfortable post epidural.   Objective: BP 121/74   Pulse 79   Temp 98.9 F (37.2 C) (Oral)   Resp 16   Ht 5\' 4"  (1.626 m)   Wt 67.1 kg   LMP 04/17/2018 (Exact Date)   SpO2 95%   BMI 25.40 kg/m  No intake/output data recorded. No intake/output data recorded.  FHT:  FHR: 160 bpm, variability: moderate,  accelerations:  Present,  decelerations:  Present variables UC:   regular, every 1-3 minutes SVE:   Dilation: 5 Effacement (%): 80 Station: -1 Exam by:: Wilford Sports, RN/Aleah Thorburn, RN  Labs: Lab Results  Component Value Date   WBC 8.0 01/02/2019   HGB 10.1 (L) 01/02/2019   HCT 29.2 (L) 01/02/2019   MCV 80.2 01/02/2019   PLT 199 01/02/2019    Assessment / Plan: Betty Harrison is a 18 y.o. G1P0000 at [redacted]w[redacted]d by admitted for induction of labor due to Pre-eclamptic toxemia of pregnancy w/o severe features.   Labor: s.p FB and pitocin. consider AROM Preeclampsia:  no signs or symptoms of toxicity Fetal Wellbeing:  Category II closely monitoring fetal tracing  Pain Control:  Epidural I/D:  gbs positive on PCN  Anticipated MOD:  NSVD anticipated. c-section if maternal/fetal indication.   Lattie Haw MD 01/02/2019, 9:42 AM

## 2019-01-02 NOTE — Discharge Summary (Signed)
Postpartum Discharge Summary    Patient Name: Betty Harrison DOB: 08/27/2000 MRN: [id number]  Date of admission: 01/02/2019 Delivering Provider: Caren Macadam   Date of discharge: 01/05/2019  Admitting diagnosis: Pregnancy  Intrauterine pregnancy: [redacted]w[redacted]d    Secondary diagnosis:  Active Problems:   Anxiety   History of depression   Preeclampsia, third trimester  Additional problems: Non-reassuring fetal heart rate, Triple I     Discharge diagnosis: Term Pregnancy Delivered                                                                                                Post partum procedures: None  Augmentation: AROM, Pitocin, Cytotec and Foley Balloon  Complications: None and Intrauterine Inflammation or infection (Chorioamniotis)  Hospital course:  Induction of Labor With Cesarean Section  18y.o. yo G1P0000 at 311w1das admitted to the hospital 01/02/2019 for induction of labor. Patient had a labor course significant for Cytotec, Foley Bulb, AROM, and Pitocin. The patient went for cesarean section due to Non-Reassuring FHR, and delivered a Viable infant,01/02/2019  Membrane Rupture Time/Date: 5:35 AM ,01/02/2019   Details of operation can be found in separate operative Note.  Patient had an uncomplicated postpartum course. She is ambulating, tolerating a regular diet, passing flatus, and urinating well.  Patient is discharged home in stable condition on 01/05/19.                                   Delivery time: 4:40 PM    Magnesium Sulfate received: No BMZ received: No Rhophylac:N/A MMR:N/A Transfusion:No  Physical exam  Vitals:   01/04/19 0605 01/04/19 1709 01/04/19 2157 01/05/19 0517  BP: (!) 127/56 136/84 133/74 126/82  Pulse: 69 84 89 74  Resp: '18 18 16 18  '$ Temp: 98.1 F (36.7 C) 98.2 F (36.8 C) 98.6 F (37 C) 98.4 F (36.9 C)  TempSrc: Oral Oral Oral Oral  SpO2: 99% 99% 97% 98%  Weight:      Height:       General: alert, cooperative and no  distress Lochia: appropriate Uterine Fundus: firm Incision: Healing well with no significant drainage, No significant erythema DVT Evaluation: No evidence of DVT seen on physical exam. No significant calf/ankle edema. Labs: Lab Results  Component Value Date   WBC 10.5 01/03/2019   HGB 7.6 (L) 01/03/2019   HCT 22.7 (L) 01/03/2019   MCV 81.4 01/03/2019   PLT 156 01/03/2019   CMP Latest Ref Rng & Units 01/02/2019  Glucose 70 - 99 mg/dL 81  BUN 6 - 20 mg/dL <5(L)  Creatinine 0.44 - 1.00 mg/dL 0.52  Sodium 135 - 145 mmol/L 137  Potassium 3.5 - 5.1 mmol/L 2.8(L)  Chloride 98 - 111 mmol/L 108  CO2 22 - 32 mmol/L 20(L)  Calcium 8.9 - 10.3 mg/dL 8.5(L)  Total Protein 6.5 - 8.1 g/dL 6.3(L)  Total Bilirubin 0.3 - 1.2 mg/dL 0.4  Alkaline Phos 38 - 126 U/L 77  AST 15 - 41 U/L 18  ALT 0 - 44 U/L 12  Discharge instruction: per After Visit Summary and "Baby and Me Booklet".  After visit meds:  Allergies as of 01/05/2019      Reactions   Ivp Dye [iodinated Diagnostic Agents] Anaphylaxis   Bee Venom Swelling   Skin reaction   Eggs Or Egg-derived Products Nausea Only   Pollen Extract Other (See Comments)   unknown      Medication List    STOP taking these medications   NIFEdipine 30 MG 24 hr tablet Commonly known as: Procardia XL     TAKE these medications   acetaminophen 325 MG tablet Commonly known as: TYLENOL Take 2 tablets (650 mg total) by mouth every 6 (six) hours.   Blood Pressure Monitor Misc For regular home bp monitoring during pregnancy   Ferrous Fumarate 324 (106 Fe) MG Tabs tablet Commonly known as: HEMOCYTE - 106 mg FE Take 1 tablet (106 mg of iron total) by mouth every other day.   ibuprofen 600 MG tablet Commonly known as: ADVIL Take 1 tablet (600 mg total) by mouth every 8 (eight) hours as needed for fever or headache.   multivitamin-prenatal 27-0.8 MG Tabs tablet Take 1 tablet by mouth daily at 12 noon.   oxyCODONE 5 MG immediate release  tablet Commonly known as: Oxy IR/ROXICODONE Take 1 tablet (5 mg total) by mouth every 4 (four) hours as needed for moderate pain.   polyethylene glycol powder 17 GM/SCOOP powder Commonly known as: GLYCOLAX/MIRALAX Take 255 g by mouth once for 1 dose.       Diet: routine diet  Activity: Advance as tolerated. Pelvic rest for 6 weeks.   Outpatient follow up:4 weeks Follow up Appt: Future Appointments  Date Time Provider [place]  01/11/2019 10:50 AM Florian Buff, MD [facility]   Follow up Visit: Follow-up Information    [facility] Follow up on 01/11/2019.   Specialty: Obstetrics and Gynecology Why: At 10:50 AM Contact information: [address] [place]         Please schedule this patient for Postpartum visit in: 4 weeks with the following provider: MD For C/S patients schedule nurse incision check in weeks 2 weeks: yes Low risk pregnancy complicated by: Pre-eclampsia without severe features Delivery mode:  CS Anticipated Birth Control:  OCPs PP Procedures needed: Incision check  Schedule Integrated BH visit: no   Newborn Data: Live born female  Birth Weight:   APGAR: 3, 35, 9  Newborn Delivery   Birth date/time: 01/02/2019 16:40:00 Delivery type: C-Section, Low Transverse Trial of labor: Yes C-section categorization: Primary      Baby Feeding: Breast Disposition:home with mother   01/05/2019 Clarnce Flock, MD

## 2019-01-02 NOTE — Progress Notes (Signed)
Patient ID: Betty Harrison, female   DOB: 24-Jul-2000, 18 y.o.   MRN: [id number]  The risks of cesarean section were discussed with the patient including but were not limited to: bleeding which may require transfusion or reoperation; infection which may require antibiotics; injury to bowel, bladder, ureters or other surrounding organs; injury to the fetus; need for additional procedures including hysterectomy in the event of a life-threatening hemorrhage; placental abnormalities wth subsequent pregnancies, incisional problems, thromboembolic phenomenon and other postoperative/anesthesia complications.  Reviewed safety of vaginal birth after CS.  Indication for CS: Fetal intolerance of labor/Non-reassuring fetal status  To OR when ready.   Caren Macadam, MD, MPH, ABFM Attending [place] for [facility]

## 2019-01-02 NOTE — Progress Notes (Signed)
Dr. Ernestina Patches at bedside discussing need for c-section due to non reassuring FHR. Explaining risks and benefits, FOB at bedside, pt verbalizes understanding and consents

## 2019-01-02 NOTE — Progress Notes (Signed)
Called to bedside by RN for occasional fetal decelerations and fetal tachycardia   Vitals:   01/02/19 1000 01/02/19 1014 01/02/19 1030 01/02/19 1100  BP: 115/64  110/68 116/70  Pulse: 80  72 78  Resp: 16  18 16   Temp:  98.7 F (37.1 C)    TempSrc:  Oral    SpO2:      Weight:      Height:       Dilation: 5.5 Effacement (%): 80 Cervical Position: Anterior Station: -1 Presentation: Vertex Exam by:: Stann Mainland, CNM  Maternal temp 98.7, Tylenol 1,000mg  ordered- possible amp and gent for suspected chorio if fetal tachycardia continues.   IUPC and FSE electrode placed  Amnioinfusion ordered   Cat II tracing  FHR 160/ minimal to mod variability/ +accels/ variable decels  Hopeful SVD   Lajean Manes, CNM 01/02/19, 11:14 AM

## 2019-01-02 NOTE — Anesthesia Procedure Notes (Signed)
Epidural Patient location during procedure: OB  Staffing Anesthesiologist: Yazmin Locher, MD Performed: anesthesiologist   Preanesthetic Checklist Completed: patient identified, site marked, surgical consent, pre-op evaluation, timeout performed, IV checked, risks and benefits discussed and monitors and equipment checked  Epidural Patient position: sitting Prep: DuraPrep Patient monitoring: heart rate, continuous pulse ox and blood pressure Approach: right paramedian Location: L3-L4 Injection technique: LOR saline  Needle:  Needle type: Tuohy  Needle gauge: 17 G Needle length: 9 cm and 9 Needle insertion depth: 6 cm Catheter type: closed end flexible Catheter size: 20 Guage Catheter at skin depth: 10 cm Test dose: negative  Assessment Events: blood not aspirated, injection not painful, no injection resistance, negative IV test and no paresthesia  Additional Notes Patient identified. Risks/Benefits/Options discussed with patient including but not limited to bleeding, infection, nerve damage, paralysis, failed block, incomplete pain control, headache, blood pressure changes, nausea, vomiting, reactions to medication both or allergic, itching and postpartum back pain. Confirmed with bedside nurse the patient's most recent platelet count. Confirmed with patient that they are not currently taking any anticoagulation, have any bleeding history or any family history of bleeding disorders. Patient expressed understanding and wished to proceed. All questions were answered. Sterile technique was used throughout the entire procedure. Please see nursing notes for vital signs. Test dose was given through epidural needle and negative prior to continuing to dose epidural or start infusion. Warning signs of high block given to the patient including shortness of breath, tingling/numbness in hands, complete motor block, or any concerning symptoms with instructions to call for help. Patient was given  instructions on fall risk and not to get out of bed. All questions and concerns addressed with instructions to call with any issues.     

## 2019-01-02 NOTE — Lactation Note (Addendum)
This note was copied from a baby's chart. Lactation Consultation Note  Patient Name: Betty Harrison [id number] Date: 01/02/2019 Reason for consult: Initial assessment;1st time breastfeeding P1, 7 hour female ETI infant. Mom hx: C/ S delivery, high functioning  autism per OB note. Mom is active on the [facility] program in [facility] but she doesn't have a breast pump at home. LC gave mom a hand  pump for prn use, mom was shown how to use hand pump & how to disassemble, clean, & reassemble parts. LC gave mom two different breast flanges mom is  asymmetrical her left breast is smaller requiring a size 24 flange whereas her left breast needs a 27 mm flange when using a hand pump.  Per mom, she has breastfed infant twice , but infant has  been reluctant to beastfed , infant would suckle a few times, stop and  only hold breast in  mouth. Per mom, Nurse taught her how to hand express and infant was given colostrum by spoon, infant been very sleepy. LC did not observe latch at this time, per mom infant last breastfed at 10:30 pm and infant is asleep in basinet. Mom has been doing STS with infant. Mom understand to breastfeed infant according hunger cues, 8 or more times within 24 hours and on demand. Mom knows if infant is reluctant to latch to hand express and give infant back volume. LC discussed with mom how to identify hunger cues with infant. Mom knows to call Nurse or [place] if she needs assistance with latching infant to breast. Reviewed Baby & Me book's Breastfeeding Basics.  Mom made aware of O/P services, breastfeeding support groups, community resources, and our phone # for post-discharge questions.    Maternal Data Formula Feeding for Exclusion: No Has patient been taught Hand Expression?: Yes(Per mom, Nurse taught her hand expression) Does the patient have breastfeeding experience prior to this delivery?: No  Feeding Feeding Type: Breast Fed  LATCH Score Latch: Too sleepy or  reluctant, no latch achieved, no sucking elicited.     Type of Nipple: Everted at rest and after stimulation  Comfort (Breast/Nipple): Soft / non-tender  Hold (Positioning): Full assist, staff holds infant at breast     Interventions Interventions: Breast feeding basics reviewed;Hand pump;Hand express;Expressed milk;Skin to skin  Lactation Tools Discussed/Used WIC Program: Yes Pump Review: Setup, frequency, and cleaning;Milk Storage Initiated by:: Vicente Serene, IBCLC Date initiated:: 01/03/19   Consult Status Consult Status: Follow-up Date: 01/03/19 Follow-up type: In-patient    Vicente Serene 01/02/2019, 11:56 PM
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
## 2019-01-02 NOTE — Transfer of Care (Signed)
Immediate Anesthesia Transfer of Care Note  Patient: Betty Harrison  Procedure(s) Performed: CESAREAN SECTION (N/A )  Patient Location: PACU  Anesthesia Type:Epidural  Level of Consciousness: awake, alert  and oriented  Airway & Oxygen Therapy: Patient Spontanous Breathing  Post-op Assessment: Report given to RN and Post -op Vital signs reviewed and stable  Post vital signs: Reviewed and stable  Last Vitals:  Vitals Value Taken Time  BP    Temp    Pulse    Resp    SpO2      Last Pain:  Vitals:   01/02/19 1536  TempSrc:   PainSc: 0-No pain         Complications: No apparent anesthesia complications
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
## 2019-01-02 NOTE — Anesthesia Preprocedure Evaluation (Addendum)
Anesthesia Evaluation  Patient identified by MRN, date of birth, ID band Patient awake    Reviewed: Allergy & Precautions, H&P , NPO status , Patient's Chart, lab work & pertinent test results  History of Anesthesia Complications Negative for: history of anesthetic complications  Airway Mallampati: II  TM Distance: >3 FB Neck ROM: full    Dental no notable dental hx. (+) Teeth Intact   Pulmonary asthma , former smoker,    Pulmonary exam normal breath sounds clear to auscultation       Cardiovascular negative cardio ROS Normal cardiovascular exam Rhythm:regular Rate:Normal     Neuro/Psych negative neurological ROS  negative psych ROS   GI/Hepatic negative GI ROS, Neg liver ROS,   Endo/Other  negative endocrine ROS  Renal/GU negative Renal ROS  negative genitourinary   Musculoskeletal   Abdominal   Peds  Hematology negative hematology ROS (+)   Anesthesia Other Findings autism  Reproductive/Obstetrics (+) Pregnancy (Pre-E)                            Anesthesia Physical Anesthesia Plan  ASA: II  Anesthesia Plan: Epidural   Post-op Pain Management:    Induction:   PONV Risk Score and Plan:   Airway Management Planned:   Additional Equipment:   Intra-op Plan:   Post-operative Plan:   Informed Consent: I have reviewed the patients History and Physical, chart, labs and discussed the procedure including the risks, benefits and alternatives for the proposed anesthesia with the patient or authorized representative who has indicated his/her understanding and acceptance.       Plan Discussed with:   Anesthesia Plan Comments:         Anesthesia Quick Evaluation
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
## 2019-01-02 NOTE — Progress Notes (Signed)
Pharmacy Antibiotic Note  Latiffany Harwick is a 18 y.o. female admitted on 01/02/2019 with chorioamnionitis.  Pharmacy has been consulted for gentamicin dosing.  Plan: Start gentamicin 300mg  (5 mg/kg using adjusted body weight) Q24H.   Height: 5\' 4"  (162.6 cm) Weight: 148 lb (67.1 kg) IBW/kg (Calculated) : 54.7  Temp (24hrs), Avg:98.5 F (36.9 C), Min:98.1 F (36.7 C), Max:98.9 F (37.2 C)  Recent Labs  Lab 01/02/19 0048 01/02/19 0143  WBC 8.0  --   CREATININE  --  0.52    Estimated Creatinine Clearance: 105.7 mL/min (by C-G formula based on SCr of 0.52 mg/dL).    Allergies  Allergen Reactions  . Ivp Dye [Iodinated Diagnostic Agents] Anaphylaxis  . Bee Venom Swelling    Skin reaction   . Eggs Or Egg-Derived Products Nausea Only  . Pollen Extract Other (See Comments)    unknown    Antimicrobials this admission: Penicillin 5 million units x1 9/30 >> 9/30 Pencillin 3 million units Q4H 9/30 >> Ampicillin 2g Q6H 9/30 >>   Dose adjustments this admission: None  Microbiology results: None   Thank you for allowing pharmacy to be a part of this patient's care.  Stefanie Libel, PharmD, BCPS, BCPPS 01/02/2019 3:49 PM

## 2019-01-02 NOTE — Op Note (Signed)
Operative Note   SURGERY DATE: 01/02/2019  PRE-OP DIAGNOSIS:  *Pregnancy at 37 weeks *Pre-eclampsia without severe features *Non-reassuring fetal heart rate  POST-OP DIAGNOSIS:  *Pregnancy at 37 weeks *Pre-eclampsia without severe features *Non-reassuring fetal heart rate *Primary Cesarean Section  PROCEDURE: primary low transverse cesarean section via pfannenstiel skin incision with double layer uterine closure  SURGEON: Surgeon(s) and Role:    * Caren Macadam, MD - Primary    * Benecio Kluger L, DO - Fellow  ASSISTANT: None  ANESTHESIA: epidural  ESTIMATED BLOOD LOSS: 633  DRAINS: 300 mL UOP via indwelling foley  TOTAL IV FLUIDS: 1500 mL crystalloid  VTE PROPHYLAXIS: SCDs to bilateral lower extremities  ANTIBIOTICS: Ampicillin and Gentamycin, within 1 hour of skin incision  SPECIMENS: Placenta sent to patholgy  COMPLICATIONS: None  INDICATIONS: Non-reassuring fetal heart rate.   The risks of cesarean section were discussed with the patient including but were not limited to: bleeding which may require transfusion or reoperation; infection which may require antibiotics; injury to bowel, bladder, ureters or other surrounding organs; injury to the fetus; need for additional procedures including hysterectomy in the event of a life-threatening hemorrhage; placental abnormalities wth subsequent pregnancies, incisional problems, thromboembolic phenomenon and other postoperative/anesthesia complications.  The patient concurred with the proposed plan, giving informed written consent for the procedures.  Patient will remain NPO for procedure. Anesthesia and OR aware.  Preoperative prophylactic antibiotics and SCDs ordered on call to the OR.  To OR when ready.  FINDINGS: No intra-abdominal adhesions were noted. Grossly normal uterus, tubes and ovaries. Clear amniotic fluid, cephalic female infant, weight per medical record, APGARs 3,6, and 9. Intact placenta.  PROCEDURE  IN DETAIL: The patient was taken to the operating room where anesthesia was administered and normal fetal heart tones were confirmed. She was then prepped and draped in the normal fashion in the dorsal supine position with a leftward tilt.  After a time out was performed, a pfannensteil skin incision was made with the scalpel and carried through to the underlying layer of fascia. The fascia was then incised at the midline and this incision was extended laterally with the mayo scissors. Attention was turned to the superior aspect of the fascial incision which was grasped with the kocher clamps x 2, tented up and the rectus muscles were dissected off bluntly and with the mayo scissors. The rectus muscles were then separated in the midline and the peritoneum was entered bluntly. The Ladashia retractor was inserted and the vesicouterine peritoneum was identified, tented up and entered with the metzenbaum scissors. This incision was extended laterally and the bladder flap was created digitally. The bladder blade was reinserted.  A low transverse hysterotomy was made with the scalpel until the endometrial cavity was breached and the amniotic sac ruptured, yielding clear amniotic fluid. This incision was extended bluntly and the infant's head, shoulders and body were delivered atraumatically.The cord was clamped x 2 and cut, and the infant was handed to the awaiting pediatricians, without cord clamping due to poor tone.  The placenta was then gradually expressed from the uterus and then the uterus was cleared of all clots and debris. The hysterotomy was repaired with a running suture of 1-0 Vicryl. A second imbricating layer of 1-0 Vicryl suture was then placed.   The hysterotomy and all operative sites were reinspected and excellent hemostasis was noted.  The peritoneum was closed with a running stitch of 3-0 Vicryl. The fascia was reapproximated with 0 Vicryl in a simple running  fashion bilaterally. The subcutaneous  layer was then reapproximated with interrupted sutures of 2-0 plain gut, and the skin was then closed with 4-0 vicryl, in a subcuticular fashion.  The patient  tolerated the procedure well. Sponge, lap, needle, and instrument counts were correct x 2. The patient was transferred to the recovery room awake, alert and breathing independently in stable condition.  Marlowe Alt, DO OB Fellow, Faculty Practice 01/02/2019 5:40 PM

## 2019-01-03 ENCOUNTER — Encounter (HOSPITAL_COMMUNITY): Payer: Self-pay | Admitting: Family Medicine

## 2019-01-03 LAB — CBC
HCT: 22.7 % — ABNORMAL LOW (ref 36.0–46.0)
Hemoglobin: 7.6 g/dL — ABNORMAL LOW (ref 12.0–15.0)
MCH: 27.2 pg (ref 26.0–34.0)
MCHC: 33.5 g/dL (ref 30.0–36.0)
MCV: 81.4 fL (ref 80.0–100.0)
Platelets: 156 10*3/uL (ref 150–400)
RBC: 2.79 MIL/uL — ABNORMAL LOW (ref 3.87–5.11)
RDW: 11.9 % (ref 11.5–15.5)
WBC: 10.5 10*3/uL (ref 4.0–10.5)
nRBC: 0 % (ref 0.0–0.2)

## 2019-01-03 LAB — RPR: RPR Ser Ql: NONREACTIVE

## 2019-01-03 MED ORDER — FERROUS FUMARATE 324 (106 FE) MG PO TABS
1.0000 | ORAL_TABLET | Freq: Two times a day (BID) | ORAL | Status: DC
  Administered 2019-01-03 – 2019-01-05 (×5): 106 mg via ORAL
  Filled 2019-01-03 (×5): qty 1

## 2019-01-03 MED ORDER — IBUPROFEN 600 MG PO TABS
600.0000 mg | ORAL_TABLET | Freq: Three times a day (TID) | ORAL | Status: DC | PRN
  Administered 2019-01-03 – 2019-01-05 (×5): 600 mg via ORAL
  Filled 2019-01-03 (×4): qty 1

## 2019-01-03 NOTE — Lactation Note (Signed)
This note was copied from a baby's chart. Lactation Consultation Note  Patient Name: Betty Harrison [id number] Date: 01/03/2019 Reason for consult: Follow-up assessment;Early term 42-38.6wks;Primapara Baby is 45 hours old.  Mom has an abundant supply of colostrum and baby has been spoon fed.  Mom called out for latch assist.  Baby is showing early feeding cues.  Positioned baby in football hold.  Baby initially not opening mouth and would not suck on gloved finger.  Large drops of colostrum expressed into baby's mouth.  After several attempts baby latched well but would fall asleep after 3-4 sucks.  Gentle stimulation done and baby repeated latch a few times but remained sleepy.  Instructed mom to hand express and spoon feed after she eats her lunch.  Encouraged mom to call for assist prn.  Maternal Data    Feeding Feeding Type: Breast Fed  LATCH Score Latch: Repeated attempts needed to sustain latch, nipple held in mouth throughout feeding, stimulation needed to elicit sucking reflex.  Audible Swallowing: None  Type of Nipple: Everted at rest and after stimulation  Comfort (Breast/Nipple): Soft / non-tender  Hold (Positioning): Assistance needed to correctly position infant at breast and maintain latch.  LATCH Score: 6  Interventions Interventions: Breast compression;Assisted with latch;Adjust position;Skin to skin;Support pillows;Breast massage;Hand express  Lactation Tools Discussed/Used     Consult Status Consult Status: Follow-up Date: 01/04/19 Follow-up type: In-patient    Ave Filter 01/03/2019, 3:38 PM

## 2019-01-03 NOTE — Progress Notes (Signed)
Subjective: Postpartum Day 1: Cesarean Delivery Patient reports incisional pain and tolerating PO.   Lactation nurse at bedside  Objective: Vital signs in last 24 hours: Temp:  [98.1 F (36.7 C)-98.9 F (37.2 C)] 98.6 F (37 C) (10/01 0440) Pulse Rate:  [61-129] 61 (10/01 0440) Resp:  [16-27] 18 (10/01 0440) BP: (110-153)/(59-109) 116/64 (10/01 0440) SpO2:  [95 %-99 %] 98 % (10/01 0440)  Physical Exam:  General: alert, cooperative and no distress Lochia: appropriate Uterine Fundus: firm Incision: no significant drainage DVT Evaluation: No evidence of DVT seen on physical exam.  Recent Labs    01/02/19 0048  HGB 10.1*  HCT 29.2*  Repeat H&H just being drawn now  Assessment/Plan: Status post Cesarean section. Doing well postoperatively.  Continue current care.  Hansel Feinstein 01/03/2019, 7:26 AM
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
## 2019-01-03 NOTE — Anesthesia Postprocedure Evaluation (Signed)
Anesthesia Post Note  Patient: Betty Harrison  Procedure(s) Performed: CESAREAN SECTION (N/A )     Patient location during evaluation: Mother Baby Anesthesia Type: Epidural Level of consciousness: awake and alert and oriented Pain management: pain level controlled Vital Signs Assessment: post-procedure vital signs reviewed and stable Respiratory status: spontaneous breathing and nonlabored ventilation Cardiovascular status: stable Postop Assessment: no headache, no backache, patient able to bend at knees, no signs of nausea or vomiting, adequate PO intake and able to ambulate Anesthetic complications: no    Last Vitals:  Vitals:   01/03/19 0817 01/03/19 1342  BP: (!) 124/56 127/73  Pulse: (!) 58 64  Resp: 18 16  Temp: 36.7 C 36.9 C  SpO2:      Last Pain:  Vitals:   01/03/19 1342  TempSrc: Oral  PainSc:    Pain Goal:                   Willa Rough

## 2019-01-03 NOTE — Lactation Note (Signed)
This note was copied from a baby's chart. Lactation Consultation Note  Patient Name: Betty Harrison [id number] Date: 01/03/2019 Reason for consult: Follow-up assessment;Early term 37-38.6wks;Primapara  LC spoke to RN and asked her to set up a DEBP for Mom, due to sleepiness at the breast, almost 24 hrs old and [redacted]w[redacted]d infant.   Mom to pump after BFing attempts.    Consult Status Consult Status: Follow-up Date: 01/04/19 Follow-up type: [place] 01/03/2019, 4:32 PM

## 2019-01-03 NOTE — Lactation Note (Addendum)
This note was copied from a baby's chart. Lactation Consultation Note  Patient Name: Girl Kasarah Sitts [id number] Date: 01/03/2019  P1, 65 hour female, ETI infant. LC changed a void diaper while in room. Mom attempted to latch infant at breast. Infant was not sustaining latch came on and off breast repeatedly  and did not have a coordinated suck.  Mom hand express and gave infant 8 ml of colostrum by spoon. Mom was doing STS when [facility] left the room.  Mom knows if infant is reluctant to latch infant to breast to hand express and give back volume.    Maternal Data    Feeding Feeding Type: Breast Fed  LATCH Score Latch: Repeated attempts needed to sustain latch, nipple held in mouth throughout feeding, stimulation needed to elicit sucking reflex.  Audible Swallowing: None  Type of Nipple: Everted at rest and after stimulation  Comfort (Breast/Nipple): Soft / non-tender  Hold (Positioning): Assistance needed to correctly position infant at breast and maintain latch.  LATCH Score: 6  Interventions Interventions: Skin to skin;Hand express;Breast compression;Position options  Lactation Tools Discussed/Used     Consult Status Consult Status: Follow-up Date: 01/03/19 Follow-up type: In-patient    Vicente Serene 01/03/2019, 7:21 AM

## 2019-01-04 LAB — SURGICAL PATHOLOGY

## 2019-01-04 MED ORDER — ACETAMINOPHEN 325 MG PO TABS
650.0000 mg | ORAL_TABLET | Freq: Four times a day (QID) | ORAL | Status: DC
  Administered 2019-01-04 – 2019-01-05 (×4): 650 mg via ORAL
  Filled 2019-01-04 (×4): qty 2

## 2019-01-04 MED ORDER — HYDROCORTISONE 1 % EX CREA
TOPICAL_CREAM | Freq: Two times a day (BID) | CUTANEOUS | Status: DC
  Administered 2019-01-04: 1 via TOPICAL
  Administered 2019-01-05: 13:00:00 via TOPICAL
  Filled 2019-01-04: qty 28

## 2019-01-04 NOTE — Lactation Note (Signed)
This note was copied from a baby's chart. Lactation Consultation Note  Patient Name: Betty Harrison [id number] Date: 01/04/2019 Reason for consult: Difficult latch;Early term 37-38.6wks;Primapara Baby is 42 hours old/8% weight loss.  Baby is still latching for brief times.  Mom is pumping every 3 hours and last obtained 25 mls of transitional milk.  Baby has been supplemented with formula and expressed breast milk with slow flow nipple.  Mom is currently pumping and baby showing feeding cues.  Assisted with positioning baby in football hold on the left.  Attempted to latch baby to breast but no latch achieved.  20 mm nipple shield applied and 5 french feeding tube placed under shield.  Baby latched well with nipple shield and good active suck/swallows observed.  Baby did need quite a bit of stimulation to stay awake after the first few minutes.  Baby took 12 mls and relaxed and content.  Instructed mom to continue to pump every 3 hours. Instructed to call for assist with shield and 5 french. Report given to [facility] RN.  Maternal Data    Feeding Feeding Type: Breast Milk  LATCH Score Latch: Grasps breast easily, tongue down, lips flanged, rhythmical sucking.(with nipple shield)  Audible Swallowing: Spontaneous and intermittent  Type of Nipple: Everted at rest and after stimulation(short)  Comfort (Breast/Nipple): Soft / non-tender  Hold (Positioning): Assistance needed to correctly position infant at breast and maintain latch.  LATCH Score: 9  Interventions Interventions: Breast compression;Assisted with latch;Adjust position;Skin to skin;Support pillows;Breast massage;DEBP  Lactation Tools Discussed/Used Tools: 75F feeding tube / Syringe;Nipple Shields Nipple shield size: 20   Consult Status Consult Status: Follow-up Date: 01/05/19 Follow-up type: In-patient    Ave Filter 01/04/2019, 11:01 AM

## 2019-01-04 NOTE — Lactation Note (Signed)
This note was copied from a baby's chart. Lactation Consultation Note  Patient Name: Betty Harrison [id number] Date: 01/04/2019 Reason for consult: Follow-up assessment Mom called out for feeding assist.  Baby's temp is 100 rectal. Placed baby skin to skin in football hold on right breast.  20 mm nipple shield applied with 5 french feeding tube under shield.  Baby stayed engaged in feeding as long as receiving milk from syringe.  She took 12 mls and then stopped sucking once milk was gone.  Assisted mom with pumping to give additional milk by bottle.  She pumped 20 mls from each breast.  Assisted mom with bottle feeding.  Report to RN. Maternal Data    Feeding Feeding Type: Breast Milk  LATCH Score Latch: Grasps breast easily, tongue down, lips flanged, rhythmical sucking.(with nipple shield)  Audible Swallowing: Spontaneous and intermittent  Type of Nipple: Everted at rest and after stimulation  Comfort (Breast/Nipple): Soft / non-tender  Hold (Positioning): Assistance needed to correctly position infant at breast and maintain latch.  LATCH Score: 9  Interventions    Lactation Tools Discussed/Used Tools: Nipple Shields Nipple shield size: 20   Consult Status Consult Status: Follow-up Date: 01/05/19 Follow-up type: In-patient    Ave Filter 01/04/2019, 3:08 PM

## 2019-01-04 NOTE — Progress Notes (Signed)
Patient ID: Betty Harrison, female   DOB: 03-26-2001, 18 y.o.   MRN: [id number]  Late entry--Clinical decision making for mode of delivery  I was called to the room after prolonged deceleration after pitocin was restarted. Patient had been on pitocin initially and developed late decelerations. Conservative measures were implemented at that time including position changes, amnioinfusion, terbutaline and pitocin rest. After the restart of the pitocin with resumption of late decelerations it was decided that the patient met criteria for fetal intolerance of labor and that C-Section was needed for safe delivery.  See progress note timed at 14:13 for the consent for C-section

## 2019-01-04 NOTE — Progress Notes (Signed)
POSTPARTUM PROGRESS NOTE  POD #2  Subjective:  Betty Harrison is a 18 y.o. G1P0000 s/p pLTCS at [redacted]w[redacted]d.  She reports she doing well. No acute events overnight. She denies any problems with ambulating, voiding or po intake. Denies nausea or vomiting. She has  passed flatus. Pain is well controlled.  Lochia is appropriate.  Objective: Blood pressure (!) 127/56, pulse 69, temperature 98.1 F (36.7 C), temperature source Oral, resp. rate 18, height 5\' 4"  (1.626 m), weight 67.1 kg, last menstrual period 04/17/2018, SpO2 99 %.  Physical Exam:  General: alert, cooperative and no distress Chest: no respiratory distress Heart:regular rate, distal pulses intact Abdomen: soft, nontender,  Uterine Fundus: firm, appropriately tender DVT Evaluation: No calf swelling or tenderness Extremities: No LE edema Skin: warm, dry; incision clean/dry/intact w/ honeycomb dressing in place; old blood marked--no fresh bleeding   Recent Labs    01/02/19 0048 01/03/19 0652  HGB 10.1* 7.6*  HCT 29.2* 22.7*    Assessment/Plan: Betty Harrison is a 18 y.o. G1P0000 s/p pLTCS at [redacted]w[redacted]d for NRFHT.  POD#2 - Doing welll; pain well controlled.  Routine postpartum care  OOB, ambulated  Lovenox for VTE prophylaxis Continue Fe supplement for asymptomatic post operative acute blood loss anemia.  Contraception: POPs Feeding: Breast  Dispo: Plan for discharge POD#3. Baby needs to stay an additional 24h to monitor temperature.    LOS: 2 days   Phill Myron, D.O. OB Fellow  01/04/2019, 12:10 PM

## 2019-01-05 MED ORDER — OXYCODONE HCL 5 MG PO TABS: 5.0000 mg | ORAL_TABLET | ORAL | 0 refills | Status: DC | PRN

## 2019-01-05 MED ORDER — FERROUS FUMARATE 324 (106 FE) MG PO TABS: 1.0000 | ORAL_TABLET | ORAL | 2 refills | Status: AC

## 2019-01-05 MED ORDER — NORETHINDRONE 0.35 MG PO TABS: 1.0000 | ORAL_TABLET | Freq: Every day | ORAL | 11 refills | Status: DC

## 2019-01-05 MED ORDER — POLYETHYLENE GLYCOL 3350 17 GM/SCOOP PO POWD: 1.0000 | Freq: Once | ORAL | 0 refills | Status: AC

## 2019-01-05 MED ORDER — ACETAMINOPHEN 325 MG PO TABS: 650.0000 mg | ORAL_TABLET | Freq: Four times a day (QID) | ORAL | 0 refills | Status: AC

## 2019-01-05 MED ORDER — IBUPROFEN 600 MG PO TABS: 600.0000 mg | ORAL_TABLET | Freq: Three times a day (TID) | ORAL | 0 refills | Status: AC | PRN

## 2019-01-05 NOTE — Lactation Note (Signed)
This note was copied from a baby's chart. Lactation Consultation Note  Patient Name: Betty Harrison YIFOY'D Date: 01/05/2019 Reason for consult: Follow-up assessment;Primapara;1st time breastfeeding;Infant weight loss;Early term 57-38.6wks  9 hours old ETI who is now being mostly fed breast milk in bottles, mom hasn't been taking baby to breast consistently, she's autistic but all the bottle feedings she's been doing are from her own breastmilk that she's providing. Mom and baby are going home today, baby is at 9% weight loss.  Mom double pumping when entering the room, she's getting about 1 ounce on each container now; she'll be taking the hand pump home until she gets a DEBP from [facility]. Dad at baby's bedside holding a bottle with breast milk, baby seems to still be cluster feeding, she startled again in the middle of [place].  Dad picked her up wrapped up and didn't unswadle when he attempt to feed baby. Showed dad how to pace feed, and to make sure baby is always on a semi-setting position and now lying down when feeding. Baby took the remaining 5 ml that were on the bottle, parents have been feeding consistently.  Reviewed discharge instructions, engorgement prevention and treatment, treatment/prevention for sore nipples, breastmilk storage guidelines, pumping schedule and red flags on when to call baby's pediatrician. Parents reported all questions and concerns were answered, they're both aware of [place] OP services and will call PRN.  Maternal Data    Feeding Feeding Type: Breast Milk Nipple Type: Slow - flow  LATCH Score                   Interventions Interventions: Breast feeding basics reviewed;DEBP  Lactation Tools Discussed/Used     Consult Status Consult Status: Complete Date: 01/05/19 Follow-up type: Call as needed    Dameshia Seybold Francene Boyers 01/05/2019, 9:26 AM

## 2019-01-05 NOTE — Discharge Instructions (Signed)
Cesarean Delivery, Care After °This sheet gives you information about how to care for yourself after your procedure. Your health care provider may also give you more specific instructions. If you have problems or questions, contact your health care provider. °What can I expect after the procedure? °After the procedure, it is common to have: °· A small amount of blood or clear fluid coming from the incision. °· Some redness, swelling, and pain in your incision area. °· Some abdominal pain and soreness. °· Vaginal bleeding (lochia). Even though you did not have a vaginal delivery, you will still have vaginal bleeding and discharge. °· Pelvic cramps. °· Fatigue. °You may have pain, swelling, and discomfort in the tissue between your vagina and your anus (perineum) if: °· Your C-section was unplanned, and you were allowed to labor and push. °· An incision was made in the area (episiotomy) or the tissue tore during attempted vaginal delivery. °Follow these instructions at home: °Incision care ° °· Follow instructions from your health care provider about how to take care of your incision. Make sure you: °? Wash your hands with soap and water before you change your bandage (dressing). If soap and water are not available, use hand sanitizer. °? If you have a dressing, change it or remove it as told by your health care provider. °? Leave stitches (sutures), skin staples, skin glue, or adhesive strips in place. These skin closures may need to stay in place for 2 weeks or longer. If adhesive strip edges start to loosen and curl up, you may trim the loose edges. Do not remove adhesive strips completely unless your health care provider tells you to do that. °· Check your incision area every day for signs of infection. Check for: °? More redness, swelling, or pain. °? More fluid or blood. °? Warmth. °? Pus or a bad smell. °· Do not take baths, swim, or use a hot tub until your health care provider says it's okay. Ask your health  care provider if you can take showers. °· When you cough or sneeze, hug a pillow. This helps with pain and decreases the chance of your incision opening up (dehiscing). Do this until your incision heals. °Medicines °· Take over-the-counter and prescription medicines only as told by your health care provider. °· If you were prescribed an antibiotic medicine, take it as told by your health care provider. Do not stop taking the antibiotic even if you start to feel better. °· Do not drive or use heavy machinery while taking prescription pain medicine. °Lifestyle °· Do not drink alcohol. This is especially important if you are breastfeeding or taking pain medicine. °· Do not use any products that contain nicotine or tobacco, such as cigarettes, e-cigarettes, and chewing tobacco. If you need help quitting, ask your health care provider. °Eating and drinking °· Drink at least 8 eight-ounce glasses of water every day unless told not to by your health care provider. If you breastfeed, you may need to drink even more water. °· Eat high-fiber foods every day. These foods may help prevent or relieve constipation. High-fiber foods include: °? Whole grain cereals and breads. °? Brown rice. °? Beans. °? Fresh fruits and vegetables. °Activity ° °· If possible, have someone help you care for your baby and help with household activities for at least a few days after you leave the hospital. °· Return to your normal activities as told by your health care provider. Ask your health care provider what activities are safe for   you. °· Rest as much as possible. Try to rest or take a nap while your baby is sleeping. °· Do not lift anything that is heavier than 10 lbs (4.5 kg), or the limit that you were told, until your health care provider says that it is safe. °· Talk with your health care provider about when you can engage in sexual activity. This may depend on your: °? Risk of infection. °? How fast you heal. °? Comfort and desire to  engage in sexual activity. °General instructions °· Do not use tampons or douches until your health care provider approves. °· Wear loose, comfortable clothing and a supportive and well-fitting bra. °· Keep your perineum clean and dry. Wipe from front to back when you use the toilet. °· If you pass a blood clot, save it and call your health care provider to discuss. Do not flush blood clots down the toilet before you get instructions from your health care provider. °· Keep all follow-up visits for you and your baby as told by your health care provider. This is important. °Contact a health care provider if: °· You have: °? A fever. °? Bad-smelling vaginal discharge. °? Pus or a bad smell coming from your incision. °? Difficulty or pain when urinating. °? A sudden increase or decrease in the frequency of your bowel movements. °? More redness, swelling, or pain around your incision. °? More fluid or blood coming from your incision. °? A rash. °? Nausea. °? Little or no interest in activities you used to enjoy. °? Questions about caring for yourself or your baby. °· Your incision feels warm to the touch. °· Your breasts turn red or become painful or hard. °· You feel unusually sad or worried. °· You vomit. °· You pass a blood clot from your vagina. °· You urinate more than usual. °· You are dizzy or light-headed. °Get help right away if: °· You have: °? Pain that does not go away or get better with medicine. °? Chest pain. °? Difficulty breathing. °? Blurred vision or spots in your vision. °? Thoughts about hurting yourself or your baby. °? New pain in your abdomen or in one of your legs. °? A severe headache. °· You faint. °· You bleed from your vagina so much that you fill more than one sanitary pad in one hour. Bleeding should not be heavier than your heaviest period. °Summary °· After the procedure, it is common to have pain at your incision site, abdominal cramping, and slight bleeding from your vagina. °· Check  your incision area every day for signs of infection. °· Tell your health care provider about any unusual symptoms. °· Keep all follow-up visits for you and your baby as told by your health care provider. °This information is not intended to replace advice given to you by your health care provider. Make sure you discuss any questions you have with your health care provider. °Document Released: 12/11/2001 Document Revised: 09/27/2017 Document Reviewed: 09/27/2017 °Elsevier Patient Education © 2020 [employer] °Preeclampsia and Eclampsia °Preeclampsia is a serious condition that may develop during pregnancy. This condition causes high blood pressure and increased protein in your urine along with other symptoms, such as headaches and vision changes. These symptoms may develop as the condition gets worse. Preeclampsia may occur at 20 weeks of pregnancy or later. °Diagnosing and treating preeclampsia early is very important. If not treated early, it can cause serious problems for you and your baby. One problem it can lead to   is eclampsia. Eclampsia is a condition that causes muscle jerking or shaking (convulsions or seizures) and other serious problems for the mother. During pregnancy, delivering your baby may be the best treatment for preeclampsia or eclampsia. For most women, preeclampsia and eclampsia symptoms go away after giving birth. °In rare cases, a woman may develop preeclampsia after giving birth (postpartum preeclampsia). This usually occurs within 48 hours after childbirth but may occur up to 6 weeks after giving birth. °What are the causes? °The cause of preeclampsia is not known. °What increases the risk? °The following risk factors make you more likely to develop preeclampsia: °· Being pregnant for the first time. °· Having had preeclampsia during a past pregnancy. °· Having a family history of preeclampsia. °· Having high blood pressure. °· Being pregnant with more than one baby. °· Being 35 or  older. °· Being African-American. °· Having kidney disease or diabetes. °· Having medical conditions such as lupus or blood diseases. °· Being very overweight (obese). °What are the signs or symptoms? °The most common symptoms are: °· Severe headaches. °· Vision problems, such as blurred or double vision. °· Abdominal pain, especially upper abdominal pain. °Other symptoms that may develop as the condition gets worse include: °· Sudden weight gain. °· Sudden swelling of the hands, face, legs, and feet. °· Severe nausea and vomiting. °· Numbness in the face, arms, legs, and feet. °· Dizziness. °· Urinating less than usual. °· Slurred speech. °· Convulsions or seizures. °How is this diagnosed? °There are no screening tests for preeclampsia. Your health care provider will ask you about symptoms and check for signs of preeclampsia during your prenatal visits. You may also have tests that include: °· Checking your blood pressure. °· Urine tests to check for protein. Your health care provider will check for this at every prenatal visit. °· Blood tests. °· Monitoring your baby's heart rate. °· Ultrasound. °How is this treated? °You and your health care provider will determine the treatment approach that is best for you. Treatment may include: °· Having more frequent prenatal exams to check for signs of preeclampsia, if you have an increased risk for preeclampsia. °· Medicine to lower your blood pressure. °· Staying in the hospital, if your condition is severe. There, treatment will focus on controlling your blood pressure and the amount of fluids in your body (fluid retention). °· Taking medicine (magnesium sulfate) to prevent seizures. This may be given as an injection or through an IV. °· Taking a low-dose aspirin during your pregnancy. °· Delivering your baby early. You may have your labor started with medicine (induced), or you may have a cesarean delivery. °Follow these instructions at home: °Eating and  drinking ° °· Drink enough fluid to keep your urine pale yellow. °· Avoid caffeine. °Lifestyle °· Do not use any products that contain nicotine or tobacco, such as cigarettes and e-cigarettes. If you need help quitting, ask your health care provider. °· Do not use alcohol or drugs. °· Avoid stress as much as possible. Rest and get plenty of sleep. °General instructions °· Take over-the-counter and prescription medicines only as told by your health care provider. °· When lying down, lie on your left side. This keeps pressure off your major blood vessels. °· When sitting or lying down, raise (elevate) your feet. Try putting some pillows underneath your lower legs. °· Exercise regularly. Ask your health care provider what kinds of exercise are best for you. °· Keep all follow-up and prenatal visits as told by your   health care provider. This is important. °How is this prevented? °There is no known way of preventing preeclampsia or eclampsia from developing. However, to lower your risk of complications and detect problems early: °· Get regular prenatal care. Your health care provider may be able to diagnose and treat the condition early. °· Maintain a healthy weight. Ask your health care provider for help managing weight gain during pregnancy. °· Work with your health care provider to manage any long-term (chronic) health conditions you have, such as diabetes or kidney problems. °· You may have tests of your blood pressure and kidney function after giving birth. °· Your health care provider may have you take low-dose aspirin during your next pregnancy. °Contact a health care provider if: °· You have symptoms that your health care provider told you may require more treatment or monitoring, such as: °? Headaches. °? Nausea or vomiting. °? Abdominal pain. °? Dizziness. °? Light-headedness. °Get help right away if: °· You have severe: °? Abdominal pain. °? Headaches that do not get better. °? Dizziness. °? Vision  problems. °? Confusion. °? Nausea or vomiting. °· You have any of the following: °? A seizure. °? Sudden, rapid weight gain. °? Sudden swelling in your hands, ankles, or face. °? Trouble moving any part of your body. °? Numbness in any part of your body. °? Trouble speaking. °? Abnormal bleeding. °· You faint. °Summary °· Preeclampsia is a serious condition that may develop during pregnancy. °· This condition causes high blood pressure and increased protein in your urine along with other symptoms, such as headaches and vision changes. °· Diagnosing and treating preeclampsia early is very important. If not treated early, it can cause serious problems for you and your baby. °· Get help right away if you have symptoms that your health care provider told you to watch for. °This information is not intended to replace advice given to you by your health care provider. Make sure you discuss any questions you have with your health care provider. °Document Released: 03/18/2000 Document Revised: 11/21/2017 Document Reviewed: 10/26/2015 °Elsevier Patient Education © 2020 [employer] Inc[employer] ° °

## 2019-01-07 ENCOUNTER — Encounter (HOSPITAL_COMMUNITY): Payer: Self-pay

## 2019-01-07 ENCOUNTER — Emergency Department (HOSPITAL_COMMUNITY)
Admission: EM | Admit: 2019-01-07 | Discharge: 2019-01-07 | Disposition: A | Discharge status: Home / Self Care | Payer: Medicaid Other | Attending: Emergency Medicine | Admitting: Emergency Medicine

## 2019-01-07 ENCOUNTER — Other Ambulatory Visit: Payer: Self-pay

## 2019-01-07 ENCOUNTER — Telehealth: Payer: Self-pay | Admitting: *Deleted

## 2019-01-07 DIAGNOSIS — Z5321 Procedure and treatment not carried out due to patient leaving prior to being seen by health care provider: Secondary | ICD-10-CM | POA: Diagnosis not present

## 2019-01-07 DIAGNOSIS — R109 Unspecified abdominal pain: Secondary | ICD-10-CM | POA: Diagnosis not present

## 2019-01-07 NOTE — Telephone Encounter (Signed)
Patients mother called stating her daughter is 5 days postpartum and has not had a bowel movement.  She is taking Miralax daily but is having rectal pain.

## 2019-01-07 NOTE — Telephone Encounter (Signed)
Pt hasn't pooped in almost 7 days. When I called pt, she was at [facility] for eval. JSY

## 2019-01-07 NOTE — ED Triage Notes (Signed)
Pt presents to ED with complaints of severe abdominal pain. Pt had C/S 5 days ago. Pt reports drainage from incision. Pt states no BM in 1 week.

## 2019-01-11 ENCOUNTER — Encounter: Payer: Self-pay | Admitting: Obstetrics & Gynecology

## 2019-01-11 ENCOUNTER — Ambulatory Visit (INDEPENDENT_AMBULATORY_CARE_PROVIDER_SITE_OTHER): Payer: Medicaid Other | Admitting: Obstetrics & Gynecology

## 2019-01-11 ENCOUNTER — Other Ambulatory Visit: Payer: Self-pay

## 2019-01-11 VITALS — BP 172/109 | HR 81 | Ht 64.0 in | Wt 128.4 lb

## 2019-01-11 DIAGNOSIS — Z98891 History of uterine scar from previous surgery: Secondary | ICD-10-CM

## 2019-01-11 MED ORDER — OXYCODONE HCL 5 MG PO TABS: 5.0000 mg | ORAL_TABLET | ORAL | 0 refills | Status: DC | PRN

## 2019-01-11 NOTE — Progress Notes (Signed)
  HPI: Patient returns for routine postoperative follow-up having undergone primary c section  on 01/02/2019.  The patient's immediate postoperative recovery has been unremarkable. Since hospital discharge the patient reports no problems.   Current Outpatient Medications: acetaminophen (TYLENOL) 325 MG tablet, Take 2 tablets (650 mg total) by mouth every 6 (six) hours., Disp: 30 tablet, Rfl: 0 Blood Pressure Monitor MISC, For regular home bp monitoring during pregnancy, Disp: 1 each, Rfl: 0 Ferrous Fumarate (HEMOCYTE - 106 MG FE) 324 (106 Fe) MG TABS tablet, Take 1 tablet (106 mg of iron total) by mouth every other day., Disp: 30 tablet, Rfl: 2 ibuprofen (ADVIL) 600 MG tablet, Take 1 tablet (600 mg total) by mouth every 8 (eight) hours as needed for fever or headache., Disp: 30 tablet, Rfl: 0 norethindrone (ORTHO MICRONOR) 0.35 MG tablet, Take 1 tablet (0.35 mg total) by mouth daily., Disp: 1 Package, Rfl: 11 oxyCODONE (OXY IR/ROXICODONE) 5 MG immediate release tablet, Take 1 tablet (5 mg total) by mouth every 4 (four) hours as needed for moderate pain., Disp: 20 tablet, Rfl: 0 Prenatal Vit-Fe Fumarate-FA (MULTIVITAMIN-PRENATAL) 27-0.8 MG TABS tablet, Take 1 tablet by mouth daily at 12 noon., Disp: , Rfl:   No current facility-administered medications for this visit.     Blood pressure (!) 172/109, pulse 81, height 5\' 4"  (1.626 m), weight 128 lb 6.4 oz (58.2 kg), currently breastfeeding.  Physical Exam: incicion clean dry intact Healing well  Diagnostic Tests:   Pathology:   Impression: S/p primary C section  Plan: Meds ordered this encounter  Medications  . oxyCODONE (OXY IR/ROXICODONE) 5 MG immediate release tablet    Sig: Take 1 tablet (5 mg total) by mouth every 4 (four) hours as needed for moderate pain.    Dispense:  20 tablet    Refill:  0     Follow up: 4  weeks  Florian Buff, MD

## 2019-01-22 ENCOUNTER — Other Ambulatory Visit: Payer: Self-pay

## 2019-01-22 ENCOUNTER — Inpatient Hospital Stay (HOSPITAL_COMMUNITY): Admission: AD | Admit: 2019-01-22 | Payer: Medicaid Other | Source: Home / Self Care

## 2019-01-22 DIAGNOSIS — Z20822 Contact with and (suspected) exposure to covid-19: Secondary | ICD-10-CM

## 2019-01-23 ENCOUNTER — Ambulatory Visit (INDEPENDENT_AMBULATORY_CARE_PROVIDER_SITE_OTHER): Payer: Medicaid Other | Admitting: Adult Health

## 2019-01-23 ENCOUNTER — Encounter: Payer: Self-pay | Admitting: Adult Health

## 2019-01-23 ENCOUNTER — Other Ambulatory Visit: Payer: Self-pay

## 2019-01-23 VITALS — BP 144/92 | HR 102 | Ht 64.0 in | Wt 122.0 lb

## 2019-01-23 DIAGNOSIS — Z3041 Encounter for surveillance of contraceptive pills: Secondary | ICD-10-CM | POA: Diagnosis not present

## 2019-01-23 DIAGNOSIS — N920 Excessive and frequent menstruation with regular cycle: Secondary | ICD-10-CM

## 2019-01-23 DIAGNOSIS — D649 Anemia, unspecified: Secondary | ICD-10-CM | POA: Diagnosis not present

## 2019-01-23 LAB — NOVEL CORONAVIRUS, NAA: SARS-CoV-2, NAA: NOT DETECTED

## 2019-01-23 LAB — POCT HEMOGLOBIN: Hemoglobin: 10.9 g/dL — AB (ref 11–14.6)

## 2019-01-23 MED ORDER — NORGESTIMATE-ETH ESTRADIOL 0.25-35 MG-MCG PO TABS: 1.0000 | ORAL_TABLET | Freq: Every day | ORAL | 11 refills | Status: AC

## 2019-01-23 NOTE — Progress Notes (Addendum)
  Subjective:     Patient ID: Betty Harrison, female   DOB: 08/03/2000, 18 y.o.   MRN: [id number]  HPI [name] is a 18 year old white female, engaged, G1P1, in complaining of bleeding since C section on 01/02/2019. She says husband and baby has cold symptoms and he had negative COVID test and she had test yesterday and did not tell us.   Review of Systems Bleeding since C section 12/23/18 Passing clots Has history of migraines without aura   Reviewed past medical,surgical, social and family history. Reviewed medications and allergies.     Objective:   Physical Exam BP (!) 144/92 (BP Location: Left Arm, Patient Position: Sitting, Cuff Size: Normal)   Pulse (!) 102   Ht 5\' 4"  (1.626 m)   Wt 122 lb (55.3 kg)   Breastfeeding No Comment: started bleeding September 30  BMI 20.94 kg/m  HBG 10.9 was 7.6 on 01/03/2019. She has mask on. Skin warm and dry. Abdomen is soft, C section scar together, no redness, still tender above scar, may have stitch left lower edge, working out.  Pelvic: external genitalia is normal in appearance no lesions, vagina:+bleeding, no odor,urethra has no lesions or masses noted, cervix:smooth, no CMT, uterus: NSSC, non tender, no masses felt, adnexa: no masses or tenderness noted. Bladder is non tender and no masses felt.  Examination chaperoned by Rolena Infante LPN Discussed with Dr Glo Herring and he suggests 35 mcg COC vs megace to stop bleeding.    Assessment:     1. Menorrhagia with regular cycle   2. Anemia, unspecified type   3. Encounter for surveillance of contraceptive pills       Plan:     Continue iron tablet Stop Micronor, will start sprintec today since no longer breast feeding to see if stops bleeding  Meds ordered this encounter  Medications  . norgestimate-ethinyl estradiol (ORTHO-CYCLEN) 0.25-35 MG-MCG tablet    Sig: Take 1 tablet by mouth daily.    Dispense:  1 Package    Refill:  11    Order Specific Question:   Supervising Provider   Answer:   Tania Ade H [[id number]]  Follow up in 1 week

## 2019-01-30 ENCOUNTER — Encounter: Payer: Self-pay | Admitting: Adult Health

## 2019-01-30 ENCOUNTER — Ambulatory Visit (INDEPENDENT_AMBULATORY_CARE_PROVIDER_SITE_OTHER): Payer: Medicaid Other | Admitting: Adult Health

## 2019-01-30 ENCOUNTER — Other Ambulatory Visit: Payer: Self-pay

## 2019-01-30 VITALS — BP 132/86 | HR 84 | Ht 64.0 in | Wt 117.0 lb

## 2019-01-30 DIAGNOSIS — F53 Postpartum depression: Secondary | ICD-10-CM | POA: Diagnosis not present

## 2019-01-30 DIAGNOSIS — N920 Excessive and frequent menstruation with regular cycle: Secondary | ICD-10-CM

## 2019-01-30 DIAGNOSIS — O99345 Other mental disorders complicating the puerperium: Secondary | ICD-10-CM | POA: Diagnosis not present

## 2019-01-30 DIAGNOSIS — D649 Anemia, unspecified: Secondary | ICD-10-CM | POA: Diagnosis not present

## 2019-01-30 LAB — POCT HEMOGLOBIN: Hemoglobin: 11 g/dL (ref 11–14.6)

## 2019-01-30 MED ORDER — ESCITALOPRAM OXALATE 10 MG PO TABS: 10.0000 mg | ORAL_TABLET | Freq: Every day | ORAL | 6 refills | Status: AC

## 2019-01-30 NOTE — Progress Notes (Signed)
  Subjective:     Patient ID: Betty Harrison, female   DOB: December 17, 2000, 18 y.o.   MRN: [id number]  HPI Midori is a 18 year old white female, G1P1 back in follow up on bleeding and it is lighter since starting sprintec.She had depression and was seen at [facility] yesterday. She has baby with her today and has good interaction.   Review of Systems Bleeding has gotten lighter +depression saw [facility] yesterday   Reviewed past medical,surgical, social and family history. Reviewed medications and allergies.     Objective:   Physical Exam BP 132/86 (BP Location: Right Arm, Patient Position: Sitting, Cuff Size: Normal)   Pulse 84   Ht 5\' 4"  (1.626 m)   Wt 117 lb (53.1 kg)   BMI 20.08 kg/m  Skin warm and dry.  Lungs: clear to ausculation bilaterally. Cardiovascular: regular rate and rhythm.   HGB 11. EDPS score 25  Assessment:     1. Menorrhagia with regular cycle   2. Anemia, unspecified type   3. Postpartum depression       Plan:     Continue sprintec Continue iron Will rx lexapro Meds ordered this encounter  Medications  . escitalopram (LEXAPRO) 10 MG tablet    Sig: Take 1 tablet (10 mg total) by mouth daily.    Dispense:  30 tablet    Refill:  6    Order Specific Question:   Supervising Provider    Answer:   Tania Ade H [[id number]]  Return in 1 week for postpartum and keep [facility] appt.

## 2019-02-01 ENCOUNTER — Encounter (HOSPITAL_COMMUNITY): Payer: Self-pay

## 2019-02-01 ENCOUNTER — Other Ambulatory Visit: Payer: Self-pay

## 2019-02-01 ENCOUNTER — Emergency Department (HOSPITAL_COMMUNITY)
Admission: EM | Admit: 2019-02-01 | Discharge: 2019-02-02 | Disposition: A | Discharge status: Home / Self Care | Payer: Medicaid Other | Attending: Emergency Medicine | Admitting: Emergency Medicine

## 2019-02-01 DIAGNOSIS — R109 Unspecified abdominal pain: Secondary | ICD-10-CM | POA: Insufficient documentation

## 2019-02-01 DIAGNOSIS — G8918 Other acute postprocedural pain: Secondary | ICD-10-CM | POA: Diagnosis not present

## 2019-02-01 DIAGNOSIS — N939 Abnormal uterine and vaginal bleeding, unspecified: Secondary | ICD-10-CM | POA: Diagnosis present

## 2019-02-01 DIAGNOSIS — Z87891 Personal history of nicotine dependence: Secondary | ICD-10-CM | POA: Diagnosis not present

## 2019-02-01 DIAGNOSIS — Z79899 Other long term (current) drug therapy: Secondary | ICD-10-CM | POA: Insufficient documentation

## 2019-02-01 DIAGNOSIS — J45909 Unspecified asthma, uncomplicated: Secondary | ICD-10-CM | POA: Diagnosis not present

## 2019-02-01 DIAGNOSIS — F840 Autistic disorder: Secondary | ICD-10-CM | POA: Diagnosis not present

## 2019-02-01 NOTE — ED Triage Notes (Signed)
Pt presents to ED with right sided flank pain started today, pt denies urinary symptoms. Pt also c/o bright red vaginal bleeding started today. Pt is 5 weeks pp, states bleeding had gotten dark brown but started bright red this am and passing clots. Pt states clots are the size of a quarter to half dollar size.

## 2019-02-02 LAB — COMPREHENSIVE METABOLIC PANEL
ALT: 10 U/L (ref 0–44)
AST: 14 U/L — ABNORMAL LOW (ref 15–41)
Albumin: 4.1 g/dL (ref 3.5–5.0)
Alkaline Phosphatase: 48 U/L (ref 38–126)
Anion gap: 8 (ref 5–15)
BUN: 11 mg/dL (ref 6–20)
CO2: 22 mmol/L (ref 22–32)
Calcium: 8.5 mg/dL — ABNORMAL LOW (ref 8.9–10.3)
Chloride: 107 mmol/L (ref 98–111)
Creatinine, Ser: 0.62 mg/dL (ref 0.44–1.00)
GFR calc Af Amer: >60 mL/min (ref >60)
GFR calc non Af Amer: >60 mL/min (ref >60)
Glucose, Bld: 87 mg/dL (ref 70–99)
Potassium: 3.3 mmol/L — ABNORMAL LOW (ref 3.5–5.1)
Sodium: 137 mmol/L (ref 135–145)
Total Bilirubin: 0.5 mg/dL (ref 0.3–1.2)
Total Protein: 7.7 g/dL (ref 6.5–8.1)

## 2019-02-02 LAB — CBC WITH DIFFERENTIAL/PLATELET
Abs Immature Granulocytes: 0.03 10*3/uL (ref 0.00–0.07)
Basophils Absolute: 0.1 10*3/uL (ref 0.0–0.1)
Basophils Relative: 1 %
Eosinophils Absolute: 0.2 10*3/uL (ref 0.0–0.5)
Eosinophils Relative: 2 %
HCT: 36.3 % (ref 36.0–46.0)
Hemoglobin: 11.1 g/dL — ABNORMAL LOW (ref 12.0–15.0)
Immature Granulocytes: 0 %
Lymphocytes Relative: 28 %
Lymphs Abs: 2.5 10*3/uL (ref 0.7–4.0)
MCH: 25.1 pg — ABNORMAL LOW (ref 26.0–34.0)
MCHC: 30.6 g/dL (ref 30.0–36.0)
MCV: 81.9 fL (ref 80.0–100.0)
Monocytes Absolute: 0.6 10*3/uL (ref 0.1–1.0)
Monocytes Relative: 7 %
Neutro Abs: 5.6 10*3/uL (ref 1.7–7.7)
Neutrophils Relative %: 62 %
Platelets: 318 10*3/uL (ref 150–400)
RBC: 4.43 MIL/uL (ref 3.87–5.11)
RDW: 12.8 % (ref 11.5–15.5)
WBC: 8.9 10*3/uL (ref 4.0–10.5)
nRBC: 0 % (ref 0.0–0.2)

## 2019-02-02 LAB — URINALYSIS, ROUTINE W REFLEX MICROSCOPIC
Bacteria, UA: NONE SEEN
Bilirubin Urine: NEGATIVE
Glucose, UA: NEGATIVE mg/dL
Ketones, ur: NEGATIVE mg/dL
Leukocytes,Ua: NEGATIVE
Nitrite: NEGATIVE
Protein, ur: NEGATIVE mg/dL
RBC / HPF: >50 RBC/hpf — ABNORMAL HIGH (ref 0–5)
Specific Gravity, Urine: 1.016 (ref 1.005–1.030)
pH: 6 (ref 5.0–8.0)

## 2019-02-02 MED ORDER — SODIUM CHLORIDE 0.9 % IV SOLN: 1000.0000 mL | INTRAVENOUS | Status: DC

## 2019-02-02 MED ORDER — SODIUM CHLORIDE 0.9 % IV BOLUS (SEPSIS)
1000.0000 mL | Freq: Once | INTRAVENOUS | Status: AC
  Administered 2019-02-02: 1000 mL via INTRAVENOUS

## 2019-02-02 MED ORDER — ONDANSETRON HCL 4 MG/2ML IJ SOLN
4.0000 mg | Freq: Once | INTRAMUSCULAR | Status: DC
  Filled 2019-02-02: qty 2

## 2019-02-02 NOTE — ED Provider Notes (Signed)
Patient presents to the emergency department with concerns over vaginal bleeding.  Patient had a C-section on September 30.  She has had persistent bleeding.  She was seen several times by her OB/GYN, started on hormone therapy for the bleeding.  She reports the bleeding had slowed down but then started again heavy today.  She does not appear to be in any distress.  Vital signs are stable.  Hemoglobin is actually going up over the course of the week.  Abdominal exam benign.  Surgical incision healed well, no concerns for infection.  Patient reassured, continue current treatment regimen prescribed by OB/GYN and follow-up with OB/GYN in the office.   Orpah Greek, MD 02/02/19 [facility] [place]
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
## 2019-02-04 LAB — POC URINE PREG, ED: Preg Test, Ur: NEGATIVE

## 2019-02-08 ENCOUNTER — Ambulatory Visit (INDEPENDENT_AMBULATORY_CARE_PROVIDER_SITE_OTHER): Payer: Medicaid Other | Admitting: Obstetrics & Gynecology

## 2019-02-08 ENCOUNTER — Other Ambulatory Visit: Payer: Self-pay

## 2019-02-08 ENCOUNTER — Encounter: Payer: Self-pay | Admitting: Obstetrics & Gynecology

## 2019-02-08 NOTE — Progress Notes (Signed)
Subjective:     Betty Harrison is a 18 y.o. female who presents for a postpartum visit. She is 6 weeks postpartum following a low cervical transverse Cesarean section. I have fully reviewed the prenatal and intrapartum course. The delivery was at 36 gestational weeks. Outcome: primary cesarean section, low transverse incision. Anesthesia: epidural. Postpartum course has been normal. Baby's course has been normal. Baby is feeding by breast. Bleeding no bleeding. Bowel function is normal. Bladder function is normal. Patient is not sexually active. Contraception method is OCP (estrogen/progesterone). Postpartum depression screening: negative.  The following portions of the patient's history were reviewed and updated as appropriate: allergies, current medications, past family history, past medical history, past social history, past surgical history and problem list.  Review of Systems Pertinent items are noted in HPI.   Objective:    BP 131/84 (BP Location: Left Arm, Patient Position: Sitting, Cuff Size: Normal)   Pulse 66   Ht 5\' 4"  (1.626 m)   Wt 117 lb (53.1 kg)   BMI 20.08 kg/m   General:  alert, cooperative and no distress   Breasts:  inspection negative, no nipple discharge or bleeding, no masses or nodularity palpable  Lungs:   Heart:    Abdomen: soft, non-tender; bowel sounds normal; no masses,  no organomegaly   Vulva:  normal  Vagina: normal vagina  Cervix:  no lesions  Corpus: normal size, contour, position, consistency, mobility, non-tender  Adnexa:  normal adnexa  Rectal Exam:         Assessment:     normal postpartum exam. Pap smear not done at today's visit.   Plan:    1. Contraception: OCP (estrogen/progesterone) 2.  3. Follow up in: 8 months or as needed.

## 2019-02-26 ENCOUNTER — Ambulatory Visit: Payer: Medicaid Other | Admitting: Family Medicine

## 2019-03-14 ENCOUNTER — Encounter (HOSPITAL_COMMUNITY): Payer: Self-pay | Admitting: *Deleted

## 2019-03-14 ENCOUNTER — Emergency Department (HOSPITAL_COMMUNITY)
Admission: EM | Admit: 2019-03-14 | Discharge: 2019-03-14 | Disposition: A | Discharge status: Home / Self Care | Payer: Medicaid Other | Attending: Emergency Medicine | Admitting: Emergency Medicine

## 2019-03-14 ENCOUNTER — Other Ambulatory Visit: Payer: Self-pay

## 2019-03-14 DIAGNOSIS — N939 Abnormal uterine and vaginal bleeding, unspecified: Secondary | ICD-10-CM | POA: Diagnosis not present

## 2019-03-14 DIAGNOSIS — J45909 Unspecified asthma, uncomplicated: Secondary | ICD-10-CM | POA: Diagnosis not present

## 2019-03-14 DIAGNOSIS — R109 Unspecified abdominal pain: Secondary | ICD-10-CM | POA: Diagnosis not present

## 2019-03-14 DIAGNOSIS — Z87891 Personal history of nicotine dependence: Secondary | ICD-10-CM | POA: Diagnosis not present

## 2019-03-14 DIAGNOSIS — R1030 Lower abdominal pain, unspecified: Secondary | ICD-10-CM | POA: Diagnosis not present

## 2019-03-14 DIAGNOSIS — Z79899 Other long term (current) drug therapy: Secondary | ICD-10-CM | POA: Insufficient documentation

## 2019-03-14 DIAGNOSIS — W19XXXA Unspecified fall, initial encounter: Secondary | ICD-10-CM

## 2019-03-14 DIAGNOSIS — W010XXA Fall on same level from slipping, tripping and stumbling without subsequent striking against object, initial encounter: Secondary | ICD-10-CM | POA: Diagnosis not present

## 2019-03-14 LAB — CBC WITH DIFFERENTIAL/PLATELET
Abs Immature Granulocytes: 0.02 10*3/uL (ref 0.00–0.07)
Basophils Absolute: 0.1 10*3/uL (ref 0.0–0.1)
Basophils Relative: 1 %
Eosinophils Absolute: 0.1 10*3/uL (ref 0.0–0.5)
Eosinophils Relative: 1 %
HCT: 34.6 % — ABNORMAL LOW (ref 36.0–46.0)
Hemoglobin: 10.2 g/dL — ABNORMAL LOW (ref 12.0–15.0)
Immature Granulocytes: 0 %
Lymphocytes Relative: 30 %
Lymphs Abs: 1.8 10*3/uL (ref 0.7–4.0)
MCH: 22.2 pg — ABNORMAL LOW (ref 26.0–34.0)
MCHC: 29.5 g/dL — ABNORMAL LOW (ref 30.0–36.0)
MCV: 75.4 fL — ABNORMAL LOW (ref 80.0–100.0)
Monocytes Absolute: 0.5 10*3/uL (ref 0.1–1.0)
Monocytes Relative: 9 %
Neutro Abs: 3.7 10*3/uL (ref 1.7–7.7)
Neutrophils Relative %: 59 %
Platelets: 355 10*3/uL (ref 150–400)
RBC: 4.59 MIL/uL (ref 3.87–5.11)
RDW: 13.9 % (ref 11.5–15.5)
WBC: 6.2 10*3/uL (ref 4.0–10.5)
nRBC: 0 % (ref 0.0–0.2)

## 2019-03-14 LAB — URINALYSIS, ROUTINE W REFLEX MICROSCOPIC
Bilirubin Urine: NEGATIVE
Glucose, UA: NEGATIVE mg/dL
Ketones, ur: NEGATIVE mg/dL
Leukocytes,Ua: NEGATIVE
Nitrite: NEGATIVE
Protein, ur: NEGATIVE mg/dL
Specific Gravity, Urine: 1.018 (ref 1.005–1.030)
pH: 6 (ref 5.0–8.0)

## 2019-03-14 LAB — COMPREHENSIVE METABOLIC PANEL
ALT: 16 U/L (ref 0–44)
AST: 16 U/L (ref 15–41)
Albumin: 3.9 g/dL (ref 3.5–5.0)
Alkaline Phosphatase: 36 U/L — ABNORMAL LOW (ref 38–126)
Anion gap: 10 (ref 5–15)
BUN: 9 mg/dL (ref 6–20)
CO2: 22 mmol/L (ref 22–32)
Calcium: 8.9 mg/dL (ref 8.9–10.3)
Chloride: 106 mmol/L (ref 98–111)
Creatinine, Ser: 0.6 mg/dL (ref 0.44–1.00)
GFR calc Af Amer: >60 mL/min (ref >60)
GFR calc non Af Amer: >60 mL/min (ref >60)
Glucose, Bld: 93 mg/dL (ref 70–99)
Potassium: 3.6 mmol/L (ref 3.5–5.1)
Sodium: 138 mmol/L (ref 135–145)
Total Bilirubin: 0.4 mg/dL (ref 0.3–1.2)
Total Protein: 7.4 g/dL (ref 6.5–8.1)

## 2019-03-14 LAB — WET PREP, GENITAL
Clue Cells Wet Prep HPF POC: NONE SEEN
Sperm: NONE SEEN
Trich, Wet Prep: NONE SEEN
Yeast Wet Prep HPF POC: NONE SEEN

## 2019-03-14 LAB — POC URINE PREG, ED: Preg Test, Ur: NEGATIVE

## 2019-03-14 MED ORDER — ACETAMINOPHEN 500 MG PO TABS
1000.0000 mg | ORAL_TABLET | Freq: Once | ORAL | Status: AC
  Administered 2019-03-14: 1000 mg via ORAL
  Filled 2019-03-14: qty 2
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
## 2019-03-14 NOTE — ED Triage Notes (Signed)
Patient presents to the ED after a fall on December 2.  Patient stated she tripped over clothing and had hit her head.  Patient denies LOC.  Patient states she is also 2 months post partum and is still bleeding.

## 2019-03-14 NOTE — Discharge Instructions (Signed)
Monitor your symptoms. If you are still bleeding by next week contact your OB-GYN for follow up.   If you have any heavy vaginal bleeding or bleeding through 1 pad per hour for more than 3 hours at a time then return to the emergency department.  If you have any fevers or severe abdominal pain and also return to the emergency department.

## 2019-03-18 LAB — GC/CHLAMYDIA PROBE AMP (~~LOC~~) NOT AT ARMC
Chlamydia: NEGATIVE
Neisseria Gonorrhea: NEGATIVE

## 2019-03-27 ENCOUNTER — Ambulatory Visit: Payer: Medicaid Other | Admitting: Family Medicine

## 2019-04-22 ENCOUNTER — Encounter (HOSPITAL_COMMUNITY): Payer: Self-pay | Admitting: Obstetrics and Gynecology

## 2019-07-11 ENCOUNTER — Ambulatory Visit: Payer: Medicaid Other | Admitting: Family Medicine
